# Patient Record
Sex: Male | Born: 1965 | Race: White | Hispanic: No | Marital: Married | Smoking: Never smoker
Health system: Southern US, Community
[De-identification: ages and names within clinical notes are randomized; demographics above are authoritative.]

## PROBLEM LIST (undated history)

## (undated) DIAGNOSIS — I10 Essential (primary) hypertension: Secondary | ICD-10-CM

## (undated) DIAGNOSIS — M1990 Unspecified osteoarthritis, unspecified site: Secondary | ICD-10-CM

## (undated) DIAGNOSIS — S4380XA Sprain of other specified parts of unspecified shoulder girdle, initial encounter: Secondary | ICD-10-CM

## (undated) DIAGNOSIS — Z832 Family history of diseases of the blood and blood-forming organs and certain disorders involving the immune mechanism: Secondary | ICD-10-CM

## (undated) DIAGNOSIS — G4730 Sleep apnea, unspecified: Secondary | ICD-10-CM

## (undated) DIAGNOSIS — M7542 Impingement syndrome of left shoulder: Secondary | ICD-10-CM

## (undated) DIAGNOSIS — E785 Hyperlipidemia, unspecified: Secondary | ICD-10-CM

## (undated) DIAGNOSIS — K219 Gastro-esophageal reflux disease without esophagitis: Secondary | ICD-10-CM

## (undated) DIAGNOSIS — I491 Atrial premature depolarization: Secondary | ICD-10-CM

## (undated) HISTORY — DX: Hyperlipidemia, unspecified: E78.5

## (undated) HISTORY — DX: Unspecified osteoarthritis, unspecified site: M19.90

## (undated) HISTORY — DX: Family history of diseases of the blood and blood-forming organs and certain disorders involving the immune mechanism: Z83.2

## (undated) HISTORY — PX: APPENDECTOMY: SHX54

## (undated) HISTORY — PX: OTHER SURGICAL HISTORY: SHX169

## (undated) HISTORY — DX: Atrial premature depolarization: I49.1

## (undated) HISTORY — PX: EYE SURGERY: SHX253

## (undated) HISTORY — PX: SHOULDER SURGERY: SHX246

## (undated) HISTORY — PX: COSMETIC SURGERY: SHX468

## (undated) HISTORY — PX: HEMORROIDECTOMY: SUR656

---
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
## 2003-02-28 ENCOUNTER — Encounter: Admission: RE | Admit: 2003-02-28 | Discharge: 2003-02-28 | Payer: Self-pay | Admitting: Neurosurgery

## 2003-02-28 ENCOUNTER — Encounter: Payer: Self-pay | Admitting: Neurosurgery

## 2006-12-14 ENCOUNTER — Ambulatory Visit (HOSPITAL_COMMUNITY): Admission: RE | Admit: 2006-12-14 | Discharge: 2006-12-14 | Payer: Self-pay | Admitting: Internal Medicine

## 2006-12-14 IMAGING — US US ABDOMEN COMPLETE
1 series · 14 of 25 positions shown · non-contrast
Comparison: none

CLINICAL DATA: Abdominal pain.
 ABDOMEN ULTRASOUND:
TECHNIQUE: Complete abdominal ultrasound examination was performed including evaluation of the liver, gallbladder, bile ducts, pancreas, kidneys, spleen, IVC, and abdominal aorta.

[Series 1: us abdomen complete · 0.32mm/px · 14 of 56 slices shown]
[im 1/56]
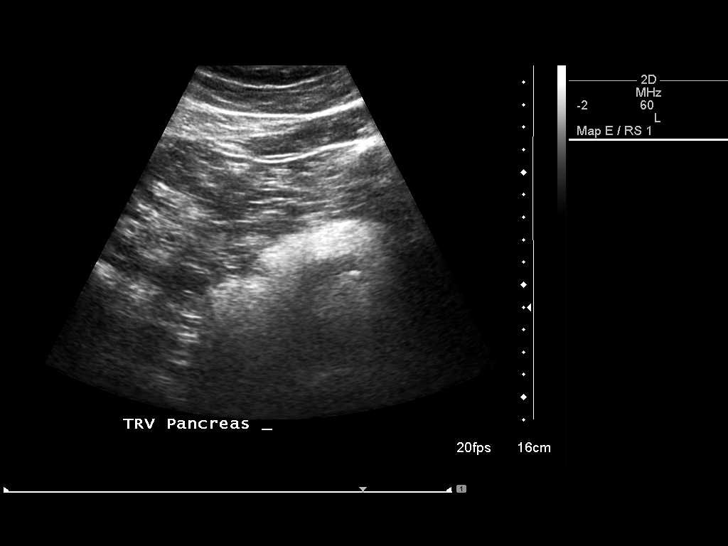
[im 5/56]
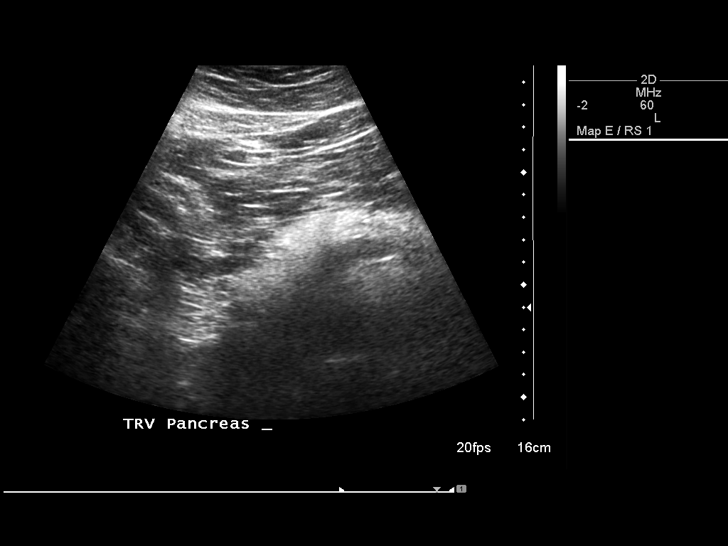
[im 10/56]
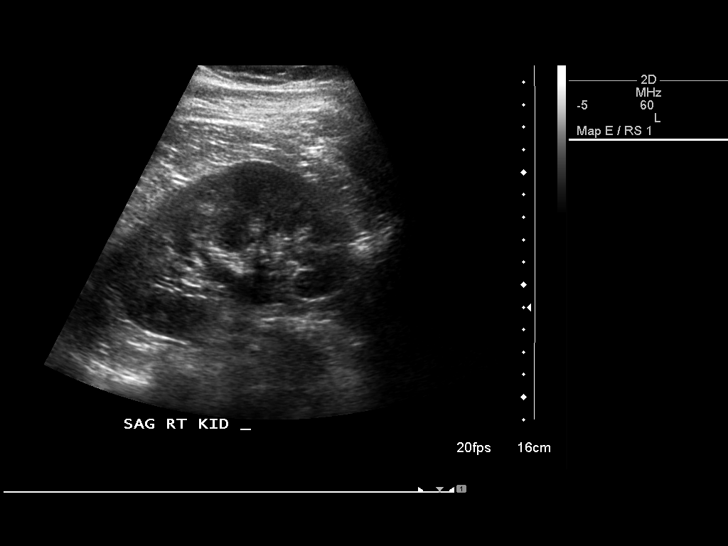
[im 14/56]
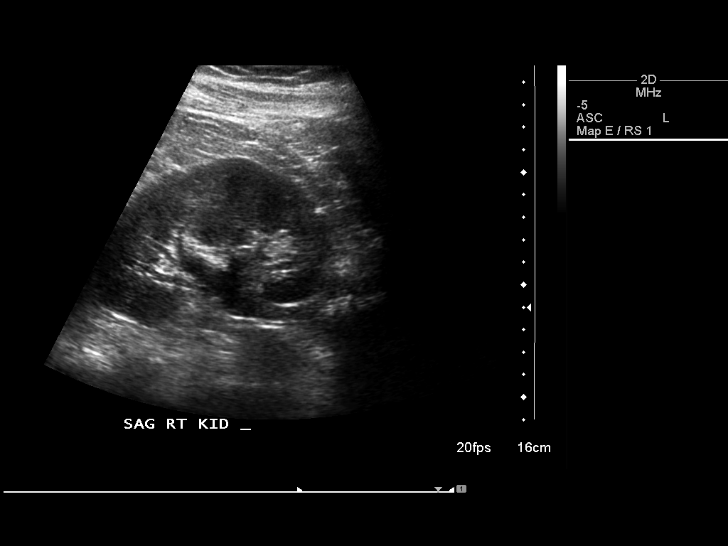
[im 19/56]
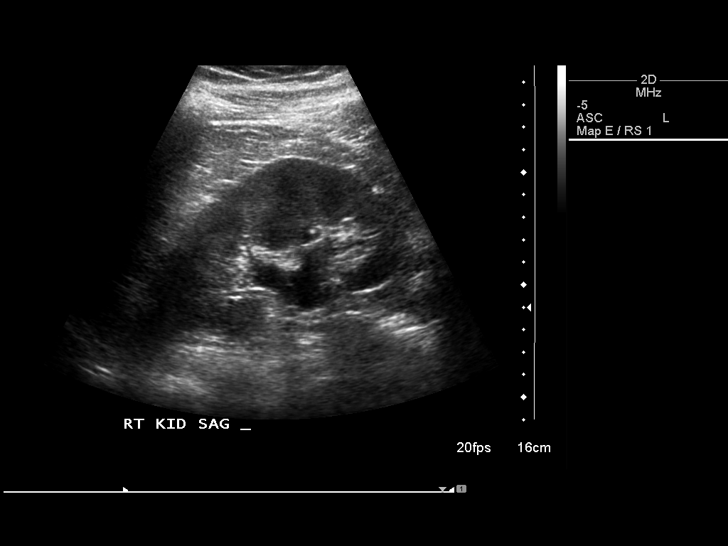
[im 21/56]
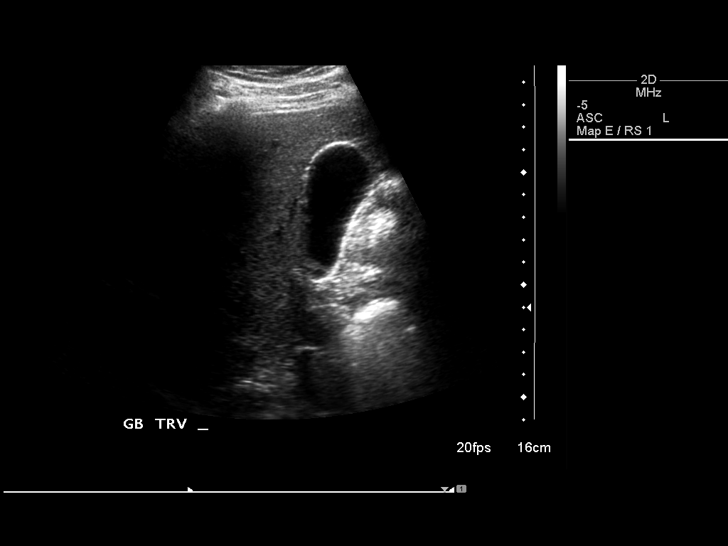
[im 26/56]
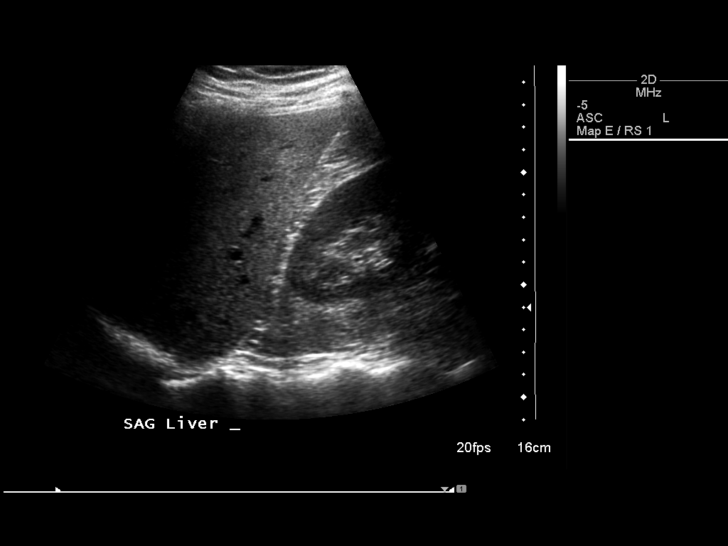
[im 30/56]
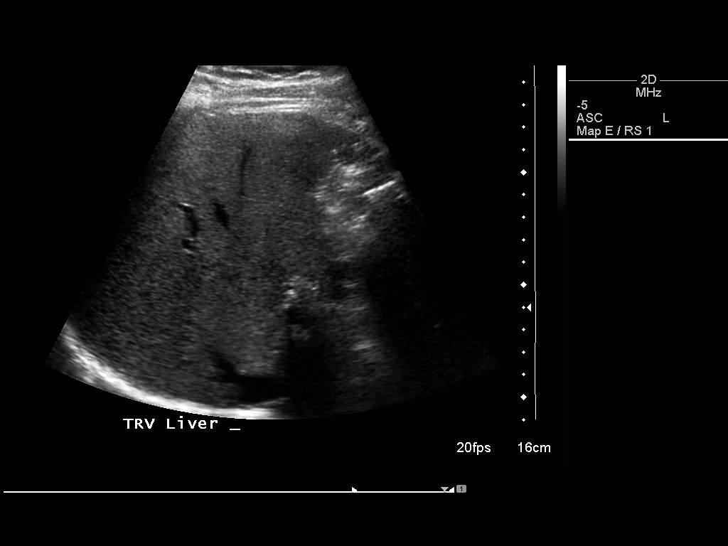
[im 35/56]
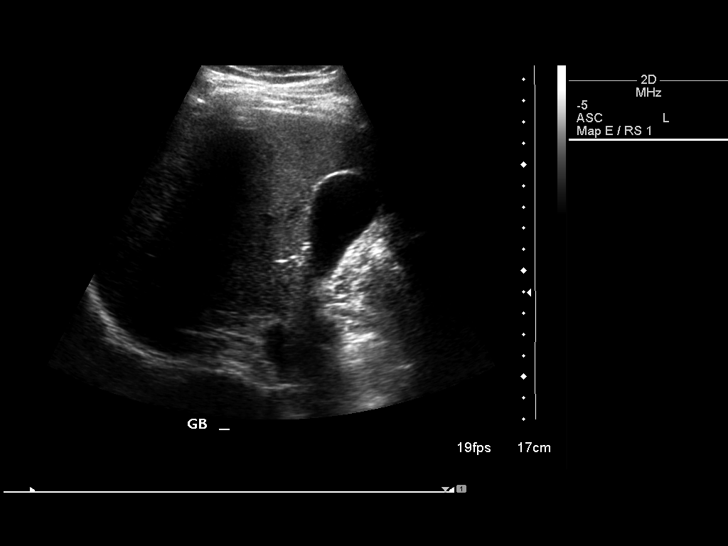
[im 37/56]
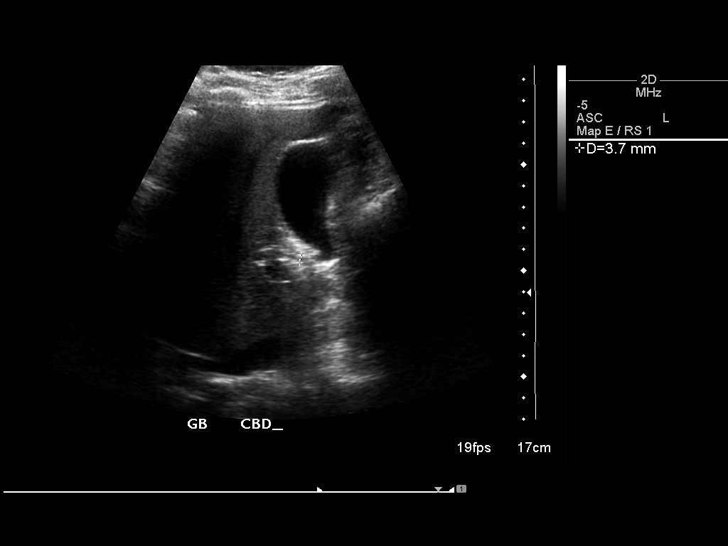
[im 42/56]
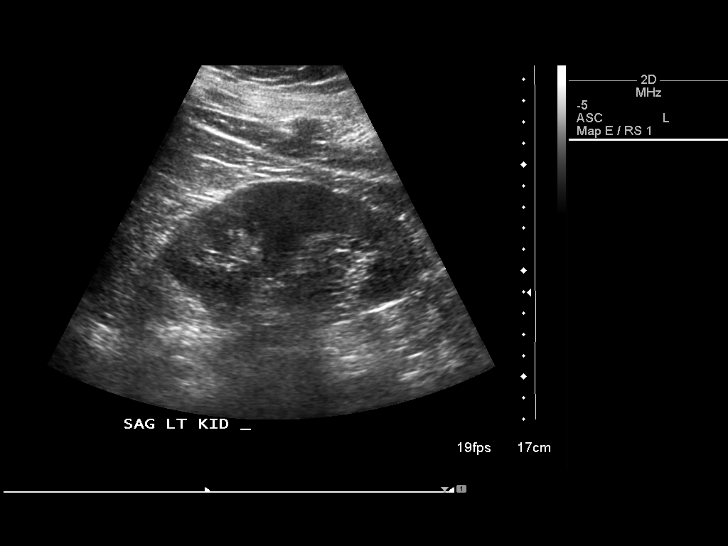
[im 46/56]
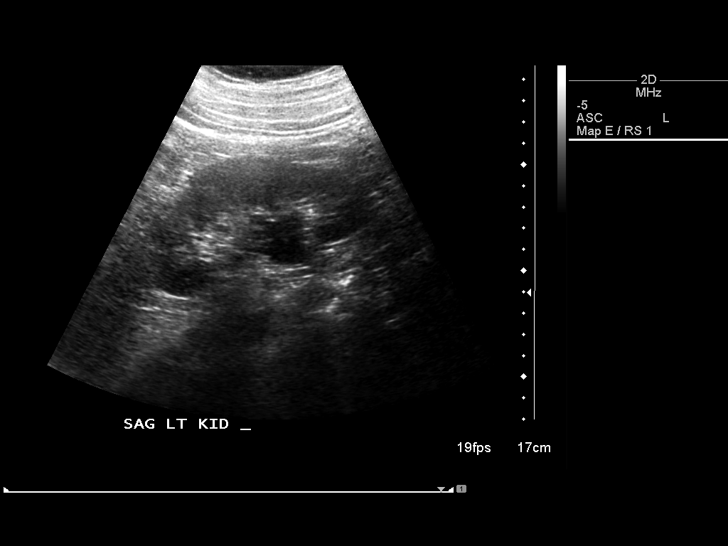
[im 51/56]
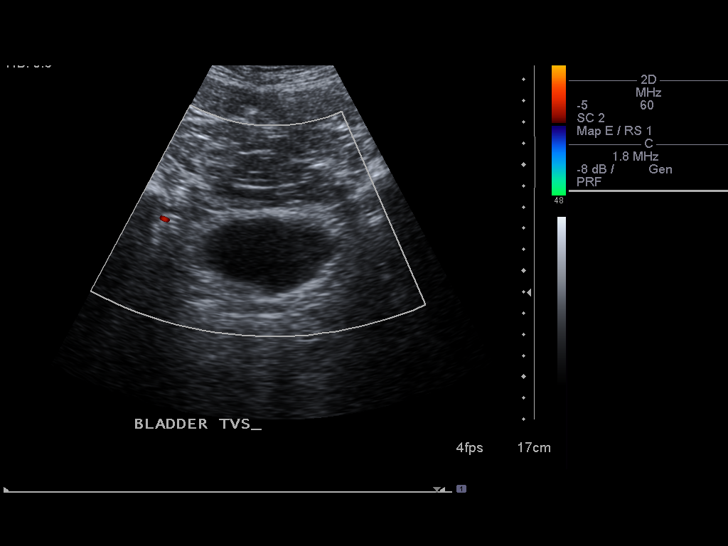
[im 56/56]
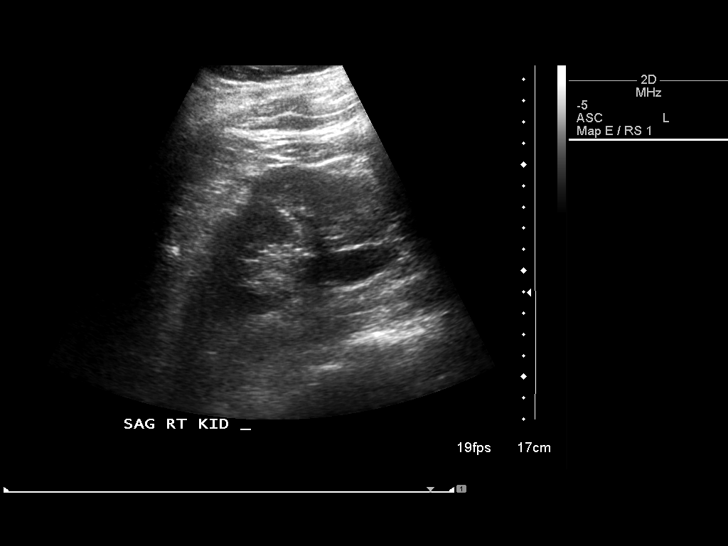

[14 of 25 positions shown; findings below may reference images not displayed]

FINDINGS: The gallbladder is well visualized and appears normal. The common bile duct is normal at 3 mm in diameter. The liver parenchyma, inferior vena cava, pancreas, spleen, and abdominal aorta are all normal. There is slight dilatation of the right renal collecting system, nonspecific.  Does the patient have renal colic-like symptoms or hematuria? There are no renal calculi.  On the left, the renal pelvis is prominent, which is probably a congenital finding, since the remainder of the left renal collecting that is visible by ultrasound is not dilated. The bladder appears grossly normal. The right kidney is 11 cm in length and the left kidney is 12.7 cm in length.
IMPRESSION: Slight dilatation of the right renal collecting system and of the left renal pelvis.  These may represent congenital variants. Does the patient have renal colic symptoms or hematuria?

## 2007-07-07 ENCOUNTER — Ambulatory Visit: Payer: Self-pay | Admitting: Internal Medicine

## 2007-07-13 ENCOUNTER — Ambulatory Visit: Payer: Self-pay

## 2007-08-13 ENCOUNTER — Ambulatory Visit: Payer: Self-pay | Admitting: Internal Medicine

## 2007-08-13 LAB — CONVERTED CEMR LAB
ALT: 43 units/L (ref 0–53)
AST: 23 units/L (ref 0–37)
Albumin: 3.9 g/dL (ref 3.5–5.2)
Alkaline Phosphatase: 80 units/L (ref 39–117)
BUN: 14 mg/dL (ref 6–23)
Bilirubin, Direct: 0.1 mg/dL (ref 0.0–0.3)
CO2: 30 meq/L (ref 19–32)
CRP, High Sensitivity: <1 — ABNORMAL LOW (ref 0.00–5.00)
Calcium: 9.4 mg/dL (ref 8.4–10.5)
Chloride: 107 meq/L (ref 96–112)
Cholesterol: 198 mg/dL (ref 0–200)
Creatinine, Ser: 0.9 mg/dL (ref 0.4–1.5)
GFR calc Af Amer: 120 mL/min
GFR calc non Af Amer: 99 mL/min
Glucose, Bld: 89 mg/dL (ref 70–99)
HDL: 45.2 mg/dL (ref >39.0)
LDL Cholesterol: 129 mg/dL — ABNORMAL HIGH (ref 0–99)
Potassium: 4.1 meq/L (ref 3.5–5.1)
Sodium: 140 meq/L (ref 135–145)
Total Bilirubin: 0.6 mg/dL (ref 0.3–1.2)
Total CHOL/HDL Ratio: 4.4
Total Protein: 6.9 g/dL (ref 6.0–8.3)
Triglycerides: 118 mg/dL (ref 0–149)
VLDL: 24 mg/dL (ref 0–40)

## 2007-09-24 ENCOUNTER — Encounter (INDEPENDENT_AMBULATORY_CARE_PROVIDER_SITE_OTHER): Payer: Self-pay | Admitting: General Surgery

## 2007-09-24 ENCOUNTER — Ambulatory Visit (HOSPITAL_COMMUNITY): Admission: RE | Admit: 2007-09-24 | Discharge: 2007-09-24 | Payer: Self-pay | Admitting: General Surgery

## 2010-10-01 NOTE — Op Note (Signed)
Larry Velez, Larry Velez              ACCOUNT NO.:  [id number]   MEDICAL RECORD NO.:  [id number]          PATIENT TYPE:  AMB   LOCATION:  DAY                           FACILITY:  [facility]   PHYSICIAN:  Dalia Heading, M.D.  DATE OF BIRTH:  11-Nov-1965   DATE OF PROCEDURE:  09/24/2007  DATE OF DISCHARGE:                               OPERATIVE REPORT   PREOPERATIVE DIAGNOSES:  Hematochezia, hemorrhoid.   POSTOPERATIVE DIAGNOSES:  Hematochezia, hemorrhoid.   PROCEDURE:  Hemorrhoidectomy, total colonoscopy.   SURGEON:  Dalia Heading, MD   ANESTHESIA:  General.   INDICATIONS:  The patient is a 44 year old white male who presents with  hematochezia as well as a hemorrhoid.  He is about to undergo a  colonoscopy and hemorrhoidectomy.  The risks and benefits of the  procedures including bleeding, infection, and perforation were fully  explained to the patient, gave informed consent.   PROCEDURE NOTE:  The patient was placed in lithotomy position after  general anesthesia was administered.  The surgical site confirmation was  performed.   Initially, a colonoscopy was performed.  The colonoscope was advanced to  the cecum without difficulty.  Confirmation of the cecum was done using  transabdominal palpation and landmarks.  The bowel preparation was  excellent.  The cecum, ascending colon, transverse colon, descending  colon, sigmoid colon, and rectum were all within normal limits.  No  abnormal lesions were noted.  An internal and external hemorrhoid was  noted at the 4 o'clock position.  All air was then evacuated from the  colon prior to removal of the endoscope.   Next, hemorrhoidectomy was performed.  The perineum was prepped and  draped using the usual sterile technique with Betadine.  Surgical site  confirmation was performed.  On rectal examination, an internal and  external hemorrhoid was noted at the 4 o'clock position.  No other mass  lesions were noted.  A LigaSure was then  used to excise the internal and  external hemorrhoid.  This vessel was then sent to pathology for further  examination.  No abnormal bleeding was noted.  0.5% Sensorcaine was  instilled into the surrounding area.  Viscous Xylocaine was then applied  to the wound.   All tape and needle counts were correct at the end of the procedure.  The patient was awakened and transferred to PACU in stable condition.   COMPLICATIONS:  None.   SPECIMEN:  Hemorrhoid.   BLOOD LOSS:  Minimal.      Dalia Heading, M.D.  Electronically Signed     MAJ/MEDQ  D:  09/24/2007  T:  09/24/2007  Job:  [id number]   cc:   Catalina Pizza, M.D.  Fax: [phone]

## 2010-10-01 NOTE — H&P (Signed)
Larry Velez, Larry Velez              ACCOUNT NO.:  [id number]   MEDICAL RECORD NO.:  [id number]          PATIENT TYPE:  AMB   LOCATION:  DAY                           FACILITY:  [facility]   PHYSICIAN:  Dalia Heading, M.D.  DATE OF BIRTH:  10/07/1965   DATE OF ADMISSION:  DATE OF DISCHARGE:  [facility]                              HISTORY & PHYSICAL   CHIEF COMPLAINT:  Hemorrhoidal disease, hematochezia.   HISTORY OF PRESENT ILLNESS:  The patient is a 45 year old white male who  is referred for endoscopic evaluation.  He needs colonoscopy for  hematochezia.  He notices blood on the toilet paper and in the stool on  occasion.  No abdominal pain, weight loss, nausea, vomiting, diarrhea,  constipation, or melena have been noted.  He has never had a  colonoscopy.  There is no family history of colon carcinoma.  He would  also like to have a hemorrhoid removed at the same time, as it gets  frequently irritated.   PAST MEDICAL HISTORY:  Includes hypertension.   PAST SURGICAL HISTORY:  Plastic surgery.   CURRENT MEDICATIONS:  Lopressor, Zocor, Nasonex, and baby aspirin.   ALLERGIES:  CODEINE.   REVIEW OF SYSTEMS:  Noncontributory.   PHYSICAL EXAMINATION:  GENERAL:  The patient is a well-developed, well-  nourished white male, in no acute distress.  LUNGS:  Clear to auscultation with equal breath sounds bilaterally.  HEART:  Reveals a regular rate and rhythm without S3, S4, or murmurs.  ABDOMEN:  Soft, nontender, and nondistended.  No hepatosplenomegaly or  masses are noted.  RECTAL:  Reveals a large external hemorrhoid present along the left  lateral aspect of the anus.   IMPRESSION:  Hemorrhoidal disease, hematochezia.   PLAN:  The patient is scheduled for colonoscopy and hemorrhoidectomy on  Sep 24, 2007.  The risks and benefits of the procedure including bleeding  and perforation were fully explained to the patient, who gave informed  consent.      Dalia Heading, M.D.  Electronically Signed     MAJ/MEDQ  D:  09/16/2007  T:  09/17/2007  Job:  [id number]   cc:   Catalina Pizza, M.D.  Fax: [phone]

## 2010-10-01 NOTE — Assessment & Plan Note (Signed)
[facility]                            CARDIOLOGY OFFICE NOTE   NAME:Velez, Larry HODES                     MRN:          [id number]  DATE:08/13/2007                            DOB:          [birth year]    PRIMARY CARE PHYSICIAN:  Catalina Pizza, M.D.   INTERVAL HISTORY:  Larry Velez is a very pleasant 45 year old male with  history of borderline hypertension and mild dyslipidemia.  He also has  palpitations secondary to PACs.  He experienced some atypical chest  pain, recurrent palpitations.  We saw him in consult last month.  He  underwent exercise Myoview which showed a good exercise capacity with an  EF of 61%.  No perfusion defects.  We also started him on some Lopressor  and had him quit his caffeine.  He says he has noticed some improvement  in his palpitations, but still gets clusters while he will feel isolated  extra beats, about 10-12 of them over 10 minutes, and then they  dissipate.  He has not had any prolonged arrhythmias.  Overall, he is  feeling some better.  He has not had any further chest pain or dyspnea.   CURRENT MEDICATIONS:  1. Lopressor 12.5 b.i.d.  2. Aspirin 81 a day.   PHYSICAL EXAMINATION:  GENERAL:  He is as well appearing in no acute  distress, ambulates around the clinic without any respiratory  difficulty.  VITAL SIGNS:  Blood pressure is 120/80, heart rate 66, weight is 216.  HEENT:  Normal.  NECK:  Supple.  No JVD.  Carotid 2+ bilaterally without bruits.  There  is no lymphadenopathy, thyromegaly.  CARDIAC:  PMI is nondisplaced.  Regular rate and rhythm.  No murmurs,  rubs or gallops.  LUNGS:  Clear.  ABDOMEN:  Soft, nontender, nondistended hepatosplenomegaly, no bruits,  no mass.  EXTREMITIES:  Warm.  No cyanosis, clubbing or edema.  NEUROLOGICAL:  Exam is grossly normal.   ASSESSMENT/PLAN:  1. Chest pain.  This was atypical.  Myoview is reassuring.  No further      workup.  2. Palpitations.  These are PACs, and  these are benign.  I have      reassured him.  He will continue taking Lopressor as needed.  3. Hyperlipidemia.  Will check fasting lipids and a CRP today.  4. Hypertension.  This is well-controlled.   DISPOSITION:  He will follow up with Dr. Margo Aye and we will see him back  in one years time for routine followup.     Bevelyn Buckles. Bensimhon, MD  Electronically Signed    DRB/MedQ  DD: 08/13/2007  DT: 08/14/2007  Job #: [id number]   cc:   Catalina Pizza, M.D.

## 2010-10-01 NOTE — Assessment & Plan Note (Signed)
[facility]                            CARDIOLOGY OFFICE NOTE   NAME:Larry Velez, Larry Velez                     MRN:          [id number]  DATE:07/07/2007                            DOB:          [birth year]    REFERRING PHYSICIAN:  Donna Christen, PA   PRIMARY CARE PHYSICIAN:  Catalina Pizza, M.D.   HISTORY OF PRESENT ILLNESS:  Mr. Hubbert is a very pleasant 45 year old  male with a history of borderline hypertension and mild dyslipidemia.  He has no known history of cardiac disease.  He was experiencing  palpitations back in August 2005 and saw Dr. Antoine Poche.  He had a normal  Holter monitor except for PACs, normal treadmill test and normal  echocardiogram.  After this episode, he became very active, lost weight  and did much better.   Unfortunately on Sunday night, he began to experience recurrent  palpitations.  He has felt a fluttering in his chest and frequent extra  hard beats.  These were fairly persistent.  There were no prolonged  episodes of palpitations.  No nausea, vomiting or diaphoresis.  Today,  he began to develop some tingling and pain in his left shoulder and down  to his elbow.  He became very concerned about this.  He went to urgent  care and had an EKG which just showed some PACs.  No ST/T wave changes.  There is no association with exertion.  He was sent here for further  evaluation.  His palpitations are feeling better.  He still has a little  bit of tingling underneath his left shoulder.   REVIEW OF SYSTEMS:  Notable for chronic back pain, some arthritis as  well as erectile dysfunction.  The remainder of the systems is negative  except for HPI and problem list.   PROBLEM:  1. History of palpitations with a negative workup as above.  2. Borderline hypertension.  3. Borderline dyslipidemia.  4. Obesity.  5. Chronic low back pain.   CURRENT MEDICATIONS:  1. Nasonex.  2. Viagra p.r.n.   ALLERGIES:  CODEINE.   SOCIAL HISTORY:  He  works as a [employer] manager.  He is married with  2 kids.  He denies any tobacco.  He drinks 2 cups of caffeine a day.   FAMILY HISTORY:  Mother is alive with a history of diabetes and  hypertension.  Father has a history of hypertension.  Grandfather has a  history of aortic aneurysm.   PHYSICAL EXAMINATION:  GENERAL:  He is well-appearing in no acute  distress.  He ambulates around the clinic without any respiratory  difficulty.  VITAL SIGNS:  Blood pressure is 118/90, heart rate of 92, weight is 218.  HEENT:  Normal.  NECK:  Supple.  No JVD.  Carotids are 2+ bilaterally without any bruits.  There is no lymphadenopathy or thyromegaly.  CARDIAC:  PMI is nondisplaced.  Regular rate and rhythm.  No murmurs,  rubs or gallops.  LUNGS:  Clear.  ABDOMEN:  Soft, nontender, nondistended.  There is no  hepatosplenomegaly, no bruits, no masses.  Good bowel sounds.  EXTREMITIES:  Warm.  No cyanosis, clubbing or edema.  Good pulses and no  rash.  NEURO:  Alert and oriented x3.  Cranial nerves II-XII are intact.  He moves all 4 extremities without  difficulty.  Affect is pleasant.   DIAGNOSTICS:  EKG shows normal sinus rhythm with occasional PACs at a  rate of 92.  There are no  ST/T wave abnormalities.   ASSESSMENT/PLAN:  1. Palpitations.  These are likely PACs.  They are benign.  We will go      ahead and start him on a little beta blocker, Lopressor 25 b.i.d.      I have asked him to cut down on his caffeine.  2. Chest and arm pain.  I suspect this is noncardiac.  However, he      does have a family history of vascular disease.  We had a long talk      about possible workup strategies including cardiac catheterization      and/or stress testing.  At this point, we will proceed with a      treadmill nuclear test to rule out any ischemia.  We will start him      on a baby aspirin.  3. Hypertension.  Blood pressure is relatively well controlled.  4. Hyperlipidemia.  This is followed by  Dr. Margo Aye.  I did suggest that      he might check a CRP to make sure this was in the normal range.      Given his erectile dysfunction and evidence of microvascular      disease, would have a low threshold to start statin therapy and      keep his LDL well controlled.   DISPOSITION:  Pending the results of his stress test.     Bevelyn Buckles. Bensimhon, MD  Electronically Signed    DRB/MedQ  DD: 07/07/2007  DT: 07/08/2007  Job #: [id number]   cc:   Catalina Pizza, M.D.  Donna Christen, PA

## 2010-11-26 ENCOUNTER — Encounter: Payer: Self-pay | Admitting: Family Medicine

## 2010-11-26 DIAGNOSIS — E785 Hyperlipidemia, unspecified: Secondary | ICD-10-CM | POA: Insufficient documentation

## 2010-11-26 DIAGNOSIS — I491 Atrial premature depolarization: Secondary | ICD-10-CM | POA: Insufficient documentation

## 2010-11-26 DIAGNOSIS — M1990 Unspecified osteoarthritis, unspecified site: Secondary | ICD-10-CM | POA: Insufficient documentation

## 2011-07-11 ENCOUNTER — Other Ambulatory Visit: Payer: Self-pay | Admitting: Orthopedic Surgery

## 2011-07-18 ENCOUNTER — Encounter (HOSPITAL_BASED_OUTPATIENT_CLINIC_OR_DEPARTMENT_OTHER): Payer: Self-pay | Admitting: Anesthesiology

## 2011-07-18 ENCOUNTER — Encounter (HOSPITAL_BASED_OUTPATIENT_CLINIC_OR_DEPARTMENT_OTHER): Payer: Self-pay | Admitting: *Deleted

## 2011-07-18 ENCOUNTER — Encounter (HOSPITAL_BASED_OUTPATIENT_CLINIC_OR_DEPARTMENT_OTHER)
Admission: RE | Disposition: A | Discharge status: Home / Self Care | Payer: Self-pay | Source: Ambulatory Visit | Attending: Orthopedic Surgery

## 2011-07-18 ENCOUNTER — Ambulatory Visit (HOSPITAL_BASED_OUTPATIENT_CLINIC_OR_DEPARTMENT_OTHER)
Admission: RE | Admit: 2011-07-18 | Discharge: 2011-07-18 | Disposition: A | Discharge status: Home / Self Care | Payer: BC Managed Care – PPO | Source: Ambulatory Visit | Attending: Orthopedic Surgery | Admitting: Orthopedic Surgery

## 2011-07-18 ENCOUNTER — Ambulatory Visit (HOSPITAL_BASED_OUTPATIENT_CLINIC_OR_DEPARTMENT_OTHER): Payer: BC Managed Care – PPO | Admitting: Anesthesiology

## 2011-07-18 ENCOUNTER — Encounter (HOSPITAL_BASED_OUTPATIENT_CLINIC_OR_DEPARTMENT_OTHER): Payer: Self-pay | Admitting: Orthopedic Surgery

## 2011-07-18 DIAGNOSIS — E785 Hyperlipidemia, unspecified: Secondary | ICD-10-CM | POA: Insufficient documentation

## 2011-07-18 DIAGNOSIS — M719 Bursopathy, unspecified: Secondary | ICD-10-CM | POA: Insufficient documentation

## 2011-07-18 DIAGNOSIS — M7542 Impingement syndrome of left shoulder: Secondary | ICD-10-CM

## 2011-07-18 DIAGNOSIS — M25819 Other specified joint disorders, unspecified shoulder: Secondary | ICD-10-CM | POA: Insufficient documentation

## 2011-07-18 DIAGNOSIS — M67919 Unspecified disorder of synovium and tendon, unspecified shoulder: Secondary | ICD-10-CM | POA: Insufficient documentation

## 2011-07-18 DIAGNOSIS — S4380XA Sprain of other specified parts of unspecified shoulder girdle, initial encounter: Secondary | ICD-10-CM | POA: Diagnosis present

## 2011-07-18 DIAGNOSIS — S46819A Strain of other muscles, fascia and tendons at shoulder and upper arm level, unspecified arm, initial encounter: Secondary | ICD-10-CM | POA: Diagnosis present

## 2011-07-18 HISTORY — DX: Strain of other muscles, fascia and tendons at shoulder and upper arm level, unspecified arm, initial encounter: S46.819A

## 2011-07-18 HISTORY — DX: Gastro-esophageal reflux disease without esophagitis: K21.9

## 2011-07-18 HISTORY — DX: Sprain of other specified parts of unspecified shoulder girdle, initial encounter: S43.80XA

## 2011-07-18 HISTORY — DX: Impingement syndrome of left shoulder: M75.42

## 2011-07-18 SURGERY — SHOULDER ARTHROSCOPY WITH SUBACROMIAL DECOMPRESSION
Anesthesia: General | Site: Shoulder | Laterality: Left | Wound class: Clean

## 2011-07-18 MED ORDER — SODIUM CHLORIDE 0.9 % IR SOLN
Status: DC | PRN
  Administered 2011-07-18: 3000 mL

## 2011-07-18 MED ORDER — MEPERIDINE HCL 25 MG/ML IJ SOLN: 6.2500 mg | INTRAMUSCULAR | Status: DC | PRN

## 2011-07-18 MED ORDER — ONDANSETRON HCL 4 MG/2ML IJ SOLN
INTRAMUSCULAR | Status: DC | PRN
  Administered 2011-07-18: 4 mg via INTRAVENOUS

## 2011-07-18 MED ORDER — DEXAMETHASONE SODIUM PHOSPHATE 4 MG/ML IJ SOLN
INTRAMUSCULAR | Status: DC | PRN
  Administered 2011-07-18: 10 mg via INTRAVENOUS

## 2011-07-18 MED ORDER — LIDOCAINE HCL (CARDIAC) 20 MG/ML IV SOLN
INTRAVENOUS | Status: DC | PRN
  Administered 2011-07-18: 50 mg via INTRAVENOUS

## 2011-07-18 MED ORDER — KETOROLAC TROMETHAMINE 10 MG PO TABS: 10.0000 mg | ORAL_TABLET | Freq: Four times a day (QID) | ORAL | Status: AC | PRN

## 2011-07-18 MED ORDER — MIDAZOLAM HCL 2 MG/2ML IJ SOLN
1.0000 mg | INTRAMUSCULAR | Status: DC | PRN
  Administered 2011-07-18: 2 mg via INTRAVENOUS

## 2011-07-18 MED ORDER — ROPIVACAINE HCL 5 MG/ML IJ SOLN
INTRAMUSCULAR | Status: DC | PRN
  Administered 2011-07-18: 30 mL via EPIDURAL

## 2011-07-18 MED ORDER — CEFAZOLIN SODIUM 1-5 GM-% IV SOLN
1.0000 g | INTRAVENOUS | Status: AC
  Administered 2011-07-18: 1 g via INTRAVENOUS

## 2011-07-18 MED ORDER — METHOCARBAMOL 500 MG PO TABS: 500.0000 mg | ORAL_TABLET | Freq: Four times a day (QID) | ORAL | Status: AC

## 2011-07-18 MED ORDER — ACETAMINOPHEN 10 MG/ML IV SOLN
1000.0000 mg | Freq: Once | INTRAVENOUS | Status: AC
  Administered 2011-07-18: 1000 mg via INTRAVENOUS

## 2011-07-18 MED ORDER — HYDROMORPHONE HCL PF 1 MG/ML IJ SOLN: 0.2500 mg | INTRAMUSCULAR | Status: DC | PRN

## 2011-07-18 MED ORDER — PROMETHAZINE HCL 25 MG PO TABS: 25.0000 mg | ORAL_TABLET | Freq: Four times a day (QID) | ORAL | Status: DC | PRN

## 2011-07-18 MED ORDER — OXYCODONE-ACETAMINOPHEN 10-325 MG PO TABS: 1.0000 | ORAL_TABLET | Freq: Four times a day (QID) | ORAL | Status: AC | PRN

## 2011-07-18 MED ORDER — PROPOFOL 10 MG/ML IV EMUL
INTRAVENOUS | Status: DC | PRN
  Administered 2011-07-18: 150 mg via INTRAVENOUS

## 2011-07-18 MED ORDER — FENTANYL CITRATE 0.05 MG/ML IJ SOLN
50.0000 ug | INTRAMUSCULAR | Status: DC | PRN
  Administered 2011-07-18: 100 ug via INTRAVENOUS

## 2011-07-18 MED ORDER — LACTATED RINGERS IV SOLN
INTRAVENOUS | Status: DC
Start: 2011-07-18 — End: 2011-07-18
  Administered 2011-07-18: 11:00:00 via INTRAVENOUS

## 2011-07-18 MED ORDER — SUCCINYLCHOLINE CHLORIDE 20 MG/ML IJ SOLN
INTRAMUSCULAR | Status: DC | PRN
  Administered 2011-07-18: 100 mg via INTRAVENOUS

## 2011-07-18 SURGICAL SUPPLY — 56 items
BENZOIN TINCTURE PRP APPL 2/3 (GAUZE/BANDAGES/DRESSINGS) ×3 IMPLANT
BLADE 4.2CUDA (BLADE) IMPLANT
BLADE CUDA GRT WHITE 3.5 (BLADE) IMPLANT
BLADE CUTTER GATOR 3.5 (BLADE) ×3 IMPLANT
BLADE GREAT WHITE 4.2 (BLADE) IMPLANT
BLADE SURG 15 STRL LF DISP TIS (BLADE) ×2 IMPLANT
BLADE SURG 15 STRL SS (BLADE) ×1
BUR OVAL 6.0 (BURR) ×3 IMPLANT
CANNULA 5.75X71 LONG (CANNULA) IMPLANT
CANNULA ACUFLEX KIT 5X76 (CANNULA) IMPLANT
CANNULA TWIST IN 8.25X7CM (CANNULA) IMPLANT
CLOTH BEACON ORANGE TIMEOUT ST (SAFETY) ×3 IMPLANT
CUTTER MENISCUS  4.2MM (BLADE)
CUTTER MENISCUS 4.2MM (BLADE) IMPLANT
DECANTER SPIKE VIAL GLASS SM (MISCELLANEOUS) IMPLANT
DRAPE INCISE IOBAN 66X45 STRL (DRAPES) ×3 IMPLANT
DRAPE SHOULDER BEACH CHAIR (DRAPES) ×3 IMPLANT
DRAPE U 20/CS (DRAPES) ×3 IMPLANT
DRAPE U-SHAPE 47X51 STRL (DRAPES) ×3 IMPLANT
DRSG PAD ABDOMINAL 8X10 ST (GAUZE/BANDAGES/DRESSINGS) ×3 IMPLANT
DURAPREP 26ML APPLICATOR (WOUND CARE) ×3 IMPLANT
ELECT REM PT RETURN 9FT ADLT (ELECTROSURGICAL) ×3
ELECTRODE REM PT RTRN 9FT ADLT (ELECTROSURGICAL) ×2 IMPLANT
GLOVE BIO SURGEON STRL SZ8 (GLOVE) ×3 IMPLANT
GLOVE INDICATOR 8.0 STRL GRN (GLOVE) ×6 IMPLANT
GLOVE ORTHO TXT STRL SZ7.5 (GLOVE) ×3 IMPLANT
GOWN PREVENTION PLUS XLARGE (GOWN DISPOSABLE) ×3 IMPLANT
GOWN PREVENTION PLUS XXLARGE (GOWN DISPOSABLE) ×3 IMPLANT
IMMOBILIZER SHOULDER XLGE (ORTHOPEDIC SUPPLIES) IMPLANT
IV NS IRRIG 3000ML ARTHROMATIC (IV SOLUTION) ×3 IMPLANT
KIT SHOULDER TRACTION (DRAPES) ×3 IMPLANT
PACK ARTHROSCOPY DSU (CUSTOM PROCEDURE TRAY) ×3 IMPLANT
PACK BASIN DAY SURGERY FS (CUSTOM PROCEDURE TRAY) ×3 IMPLANT
SET ARTHROSCOPY TUBING (MISCELLANEOUS) ×1
SET ARTHROSCOPY TUBING LN (MISCELLANEOUS) ×2 IMPLANT
SHEET MEDIUM DRAPE 40X70 STRL (DRAPES) ×3 IMPLANT
SLEEVE SCD COMPRESS KNEE MED (MISCELLANEOUS) ×3 IMPLANT
SLING ARM FOAM STRAP LRG (SOFTGOODS) ×3 IMPLANT
SLING ARM FOAM STRAP MED (SOFTGOODS) IMPLANT
SLING ARM FOAM STRAP XLG (SOFTGOODS) IMPLANT
SLING ARM IMMOBILIZER LRG (SOFTGOODS) IMPLANT
SLING ARM IMMOBILIZER MED (SOFTGOODS) IMPLANT
SPONGE GAUZE 4X4 12PLY (GAUZE/BANDAGES/DRESSINGS) ×3 IMPLANT
STRIP CLOSURE SKIN 1/2X4 (GAUZE/BANDAGES/DRESSINGS) ×3 IMPLANT
SUT FIBERWIRE #2 38 T-5 BLUE (SUTURE)
SUT MNCRL AB 4-0 PS2 18 (SUTURE) IMPLANT
SUT PDS AB 1 CT  36 (SUTURE)
SUT PDS AB 1 CT 36 (SUTURE) IMPLANT
SUT TIGER TAPE 7 IN WHITE (SUTURE) IMPLANT
SUT VIC AB 3-0 SH 27 (SUTURE)
SUT VIC AB 3-0 SH 27X BRD (SUTURE) IMPLANT
SUTURE FIBERWR #2 38 T-5 BLUE (SUTURE) IMPLANT
TOWEL OR 17X24 6PK STRL BLUE (TOWEL DISPOSABLE) ×3 IMPLANT
TUBE CONNECTING 20X1/4 (TUBING) ×3 IMPLANT
WAND STAR VAC 90 (SURGICAL WAND) ×3 IMPLANT
WATER STERILE IRR 1000ML POUR (IV SOLUTION) ×3 IMPLANT

## 2011-07-18 NOTE — H&P (Signed)
  PREOPERATIVE H&P  Chief Complaint: left shoulder sprain/strain/tear and upper arm, complete rupture of rotator cuff, impingement  HPI: Larry Velez is a 46 y.o. male who presents for preoperative history and physical with a diagnosis of left shoulder sprain/strain/tear and upper arm, complete rupture of rotator cuff, impingement. Symptoms are rated as moderate to severe, and have been worsening.  This is significantly impairing activities of daily living.  He has elected for surgical management.   Past Medical History  Diagnosis Date  . Hyperlipidemia   . DJD (degenerative joint disease)   . Heart palpitations   . Dysrhythmia     pa;pitations x 1 3 years ago, negative stress test.  no tx.  has not seen cardiologist since  . GERD (gastroesophageal reflux disease)     resolved with diet change, no meds for 3 months   Past Surgical History  Procedure Date  . Cosmetic surgery 1972    On both ears  . Hemorroidectomy 4 years ago  . Eye surgery     lasik   History   Social History  . Marital Status: Married    Spouse Name: N/A    Number of Children: N/A  . Years of Education: N/A   Social History Main Topics  . Smoking status: Not on file  . Smokeless tobacco: Not on file  . Alcohol Use:   . Drug Use:   . Sexually Active:    Other Topics Concern  . Not on file   Social History Narrative  . No narrative on file   No family history on file. Allergies  Allergen Reactions  . Amoxicillin   . Codeine    Prior to Admission medications   Medication Sig Start Date End Date Taking? Authorizing Provider  diphenhydrAMINE (BENADRYL) 25 MG tablet Take 25 mg by mouth as needed.   Yes Historical Provider, MD  HYDROcodone-acetaminophen (NORCO) 5-325 MG per tablet Take 1 tablet by mouth as needed.   Yes Historical Provider, MD  naproxen sodium (ANAPROX) 220 MG tablet Take 220 mg by mouth as needed.   Yes Historical Provider, MD  oxyCODONE-acetaminophen (PERCOCET) 5-325 MG per  tablet Take 1 tablet by mouth as needed.   Yes Historical Provider, MD     Positive ROS: All other systems have been reviewed and were otherwise negative with the exception of those mentioned in the HPI and as above.  Physical Exam: General: Alert, no acute distress Cardiovascular: No pedal edema Respiratory: No cyanosis, no use of accessory musculature GI: No organomegaly, abdomen is soft and non-tender Skin: No lesions in the area of chief complaint Neurologic: Sensation intact distally Psychiatric: Patient is competent for consent with normal mood and affect Lymphatic: No axillary or cervical lymphadenopathy  MUSCULOSKELETAL: Left shoulder has positive impingement signs with weakness with supraspinatus testing, partially secondary to pain.  Assessment: left shoulder sprain/strain/tear and upper arm, complete rupture of rotator cuff, impingement  Plan: Plan for Procedure(s): SHOULDER ARTHROSCOPY WITH ROTATOR CUFF REPAIR AND SUBACROMIAL DECOMPRESSION  The risks benefits and alternatives were discussed with the patient including but not limited to the risks of nonoperative treatment, versus surgical intervention including infection, bleeding, nerve injury,  blood clots, cardiopulmonary complications, morbidity, mortality, among others, and they were willing to proceed. We have also discussed the risk for repeat rupture, stiffness, incomplete relief of pain, among others.  Eulas Post, MD 07/18/2011 11:26 AM

## 2011-07-18 NOTE — Anesthesia Procedure Notes (Addendum)
Anesthesia Regional Block:  Interscalene brachial plexus block  Pre-Anesthetic Checklist: ,, timeout performed, Correct Patient, Correct Site, Correct Laterality, Correct Procedure, Correct Position, site marked, Risks and benefits discussed, pre-op evaluation,  At surgeon's request and post-op pain management  Laterality: Left  Prep: Maximum Sterile Barrier Precautions used and chloraprep       Needles:  Injection technique: Single-shot  Needle Type: Echogenic Stimulator Needle      Needle Gauge: 22 and 22 G    Additional Needles:  Procedures: ultrasound guided and nerve stimulator Interscalene brachial plexus block  Nerve Stimulator or Paresthesia:  Response: Biceps response, 0.4 mA,   Additional Responses:   Narrative:  Start time: 07/18/2011 11:06 AM End time: 07/18/2011 11:20 AM Injection made incrementally with aspirations every 5 mL. Anesthesiologist: Sampson Goon, MD  Additional Notes: 2% Lidocaine skin wheel.   Interscalene brachial plexus block Procedure Name: LMA Insertion Performed by: Sharyne Richters Pre-anesthesia Checklist: Patient identified, Timeout performed, Emergency Drugs available, Suction available and Patient being monitored Patient Re-evaluated:Patient Re-evaluated prior to inductionOxygen Delivery Method: Circle system utilized Preoxygenation: Pre-oxygenation with 100% oxygen Intubation Type: IV induction Ventilation: Mask ventilation without difficulty Laryngoscope Size: Miller and 2 Grade View: Grade I Tube type: Oral Tube size: 8.0 mm Number of attempts: 1 Placement Confirmation: ETT inserted through vocal cords under direct vision,  positive ETCO2 and breath sounds checked- equal and bilateral Secured at: 22 cm Tube secured with: Tape Dental Injury: Teeth and Oropharynx as per pre-operative assessment

## 2011-07-18 NOTE — Anesthesia Preprocedure Evaluation (Signed)
Anesthesia Evaluation  Patient identified by MRN, date of birth, ID band Patient awake    Reviewed: Allergy & Precautions, H&P , NPO status , Patient's Chart, lab work & pertinent test results  Airway Mallampati: II TM Distance: >3 FB Neck ROM: Full    Dental No notable dental hx. (+) Teeth Intact   Pulmonary neg pulmonary ROS,    Pulmonary exam normal       Cardiovascular neg cardio ROS     Neuro/Psych Negative Neurological ROS  Negative Psych ROS   GI/Hepatic negative GI ROS, Neg liver ROS, Controlled,  Endo/Other  Negative Endocrine ROS  Renal/GU negative Renal ROS  Genitourinary negative   Musculoskeletal   Abdominal   Peds  Hematology negative hematology ROS (+)   Anesthesia Other Findings   Reproductive/Obstetrics negative OB ROS                           Anesthesia Physical Anesthesia Plan  ASA: I  Anesthesia Plan: General   Post-op Pain Management:    Induction: Intravenous  Airway Management Planned: Oral ETT  Additional Equipment:   Intra-op Plan:   Post-operative Plan: Extubation in OR  Informed Consent: I have reviewed the patients History and Physical, chart, labs and discussed the procedure including the risks, benefits and alternatives for the proposed anesthesia with the patient or authorized representative who has indicated his/her understanding and acceptance.     Plan Discussed with: CRNA  Anesthesia Plan Comments:         Anesthesia Quick Evaluation

## 2011-07-18 NOTE — Progress Notes (Signed)
Assisted Dr. Fitzgerald with left, ultrasound guided, interscalene  block. Side rails up, monitors on throughout procedure. See vital signs in flow sheet. Tolerated Procedure well. 

## 2011-07-18 NOTE — Discharge Instructions (Signed)
Impingement Syndrome, Rotator Cuff, Bursitis with Rehab Impingement syndrome is a condition that involves inflammation of the tendons of the rotator cuff and the subacromial bursa, that causes pain in the shoulder. The rotator cuff consists of four tendons and muscles that control much of the shoulder and upper arm function. The subacromial bursa is a fluid filled sac that helps reduce friction between the rotator cuff and one of the bones of the shoulder (acromion). Impingement syndrome is usually an overuse injury that causes swelling of the bursa (bursitis), swelling of the tendon (tendonitis), and/or a tear of the tendon (strain). Strains are classified into three categories. Grade 1 strains cause pain, but the tendon is not lengthened. Grade 2 strains include a lengthened ligament, due to the ligament being stretched or partially ruptured. With grade 2 strains there is still function, although the function may be decreased. Grade 3 strains include a complete tear of the tendon or muscle, and function is usually impaired. SYMPTOMS   Pain around the shoulder, often at the outer portion of the upper arm.   Pain that gets worse with shoulder function, especially when reaching overhead or lifting.   Sometimes, aching when not using the arm.   Pain that wakes you up at night.   Sometimes, tenderness, swelling, warmth, or redness over the affected area.   Loss of strength.   Limited motion of the shoulder, especially reaching behind the back (to the back pocket or to unhook bra) or across your body.   Crackling sound (crepitation) when moving the arm.   Biceps tendon pain and inflammation (in the front of the shoulder). Worse when bending the elbow or lifting.  CAUSES  Impingement syndrome is often an overuse injury, in which chronic (repetitive) motions cause the tendons or bursa to become inflamed. A strain occurs when a force is paced on the tendon or muscle that is greater than it can  withstand. Common mechanisms of injury include: Stress from sudden increase in duration, frequency, or intensity of training.  Direct hit (trauma) to the shoulder.   Aging, erosion of the tendon with normal use.   Bony bump on shoulder (acromial spur).  RISK INCREASES WITH:  Contact sports (football, wrestling, boxing).   Throwing sports (baseball, tennis, volleyball).   Weightlifting and bodybuilding.   Heavy labor.   Previous injury to the rotator cuff, including impingement.   Poor shoulder strength and flexibility.   Failure to warm up properly before activity.   Inadequate protective equipment.   Old age.   Bony bump on shoulder (acromial spur).  PREVENTION   Warm up and stretch properly before activity.   Allow for adequate recovery between workouts.   Maintain physical fitness:   Strength, flexibility, and endurance.   Cardiovascular fitness.   Learn and use proper exercise technique.  PROGNOSIS  If treated properly, impingement syndrome usually goes away within 6 weeks. Sometimes surgery is required.  RELATED COMPLICATIONS   Longer healing time if not properly treated, or if not given enough time to heal.   Recurring symptoms, that result in a chronic condition.   Shoulder stiffness, frozen shoulder, or loss of motion.   Rotator cuff tendon tear.   Recurring symptoms, especially if activity is resumed too soon, with overuse, with a direct blow, or when using poor technique.  TREATMENT  Treatment first involves the use of ice and medicine, to reduce pain and inflammation. The use of strengthening and stretching exercises may help reduce pain with activity. These exercises may   be performed at home or with a therapist. If non-surgical treatment is unsuccessful after more than 6 months, surgery may be advised. After surgery and rehabilitation, activity is usually possible in 3 months.  MEDICATION  If pain medicine is needed, nonsteroidal  anti-inflammatory medicines (aspirin and ibuprofen), or other minor pain relievers (acetaminophen), are often advised.   Do not take pain medicine for 7 days before surgery.   Prescription pain relievers may be given, if your caregiver thinks they are needed. Use only as directed and only as much as you need.   Corticosteroid injections may be given by your caregiver. These injections should be reserved for the most serious cases, because they may only be given a certain number of times.  HEAT AND COLD  Cold treatment (icing) should be applied for 10 to 15 minutes every 2 to 3 hours for inflammation and pain, and immediately after activity that aggravates your symptoms. Use ice packs or an ice massage.   Heat treatment may be used before performing stretching and strengthening activities prescribed by your caregiver, physical therapist, or athletic trainer. Use a heat pack or a warm water soak.  SEEK MEDICAL CARE IF:   Symptoms get worse or do not improve in 4 to 6 weeks, despite treatment.   New, unexplained symptoms develop. (Drugs used in treatment may produce side effects.)  EXERCISES  RANGE OF MOTION (ROM) AND STRETCHING EXERCISES - Impingement Syndrome (Rotator Cuff  Tendinitis, Bursitis) These exercises may help you when beginning to rehabilitate your injury. Your symptoms may go away with or without further involvement from your physician, physical therapist or athletic trainer. While completing these exercises, remember:   Restoring tissue flexibility helps normal motion to return to the joints. This allows healthier, less painful movement and activity.   An effective stretch should be held for at least 30 seconds.   A stretch should never be painful. You should only feel a gentle lengthening or release in the stretched tissue.  STRETCH - Flexion, Standing  Stand with good posture. With an underhand grip on your right / left hand, and an overhand grip on the opposite hand, grasp  a broomstick or cane so that your hands are a little more than shoulder width apart.   Keeping your right / left elbow straight and shoulder muscles relaxed, push the stick with your opposite hand, to raise your right / left arm in front of your body and then overhead. Raise your arm until you feel a stretch in your right / left shoulder, but before you have increased shoulder pain.   Try to avoid shrugging your right / left shoulder as your arm rises, by keeping your shoulder blade tucked down and toward your mid-back spine. Hold for __________ seconds.   Slowly return to the starting position.  Repeat __________ times. Complete this exercise __________ times per day. STRETCH - Abduction, Supine  Lie on your back. With an underhand grip on your right / left hand and an overhand grip on the opposite hand, grasp a broomstick or cane so that your hands are a little more than shoulder width apart.   Keeping your right / left elbow straight and your shoulder muscles relaxed, push the stick with your opposite hand, to raise your right / left arm out to the side of your body and then overhead. Raise your arm until you feel a stretch in your right / left shoulder, but before you have increased shoulder pain.   Try to avoid shrugging  your right / left shoulder as your arm rises, by keeping your shoulder blade tucked down and toward your mid-back spine. Hold for __________ seconds.   Slowly return to the starting position.  Repeat __________ times. Complete this exercise __________ times per day. ROM - Flexion, Active-Assisted  Lie on your back. You may bend your knees for comfort.   Grasp a broomstick or cane so your hands are about shoulder width apart. Your right / left hand should grip the end of the stick, so that your hand is positioned "thumbs-up," as if you were about to shake hands.   Using your healthy arm to lead, raise your right / left arm overhead, until you feel a gentle stretch in your  shoulder. Hold for __________ seconds.   Use the stick to assist in returning your right / left arm to its starting position.  Repeat __________ times. Complete this exercise __________ times per day.  ROM - Internal Rotation, Supine   Lie on your back on a firm surface. Place your right / left elbow about 60 degrees away from your side. Elevate your elbow with a folded towel, so that the elbow and shoulder are the same height.   Using a broomstick or cane and your strong arm, pull your right / left hand toward your body until you feel a gentle stretch, but no increase in your shoulder pain. Keep your shoulder and elbow in place throughout the exercise.   Hold for __________ seconds. Slowly return to the starting position.  Repeat __________ times. Complete this exercise __________ times per day. STRETCH - Internal Rotation  Place your right / left hand behind your back, palm up.   Throw a towel or belt over your opposite shoulder. Grasp the towel with your right / left hand.   While keeping an upright posture, gently pull up on the towel, until you feel a stretch in the front of your right / left shoulder.   Avoid shrugging your right / left shoulder as your arm rises, by keeping your shoulder blade tucked down and toward your mid-back spine.   Hold for __________ seconds. Release the stretch, by lowering your healthy hand.  Repeat __________ times. Complete this exercise __________ times per day. ROM - Internal Rotation   Using an underhand grip, grasp a stick behind your back with both hands.   While standing upright with good posture, slide the stick up your back until you feel a mild stretch in the front of your shoulder.   Hold for __________ seconds. Slowly return to your starting position.  Repeat __________ times. Complete this exercise __________ times per day.  STRETCH - Posterior Shoulder Capsule   Stand or sit with good posture. Grasp your right / left elbow and draw it  across your chest, keeping it at the same height as your shoulder.   Pull your elbow, so your upper arm comes in closer to your chest. Pull until you feel a gentle stretch in the back of your shoulder.   Hold for __________ seconds.  Repeat __________ times. Complete this exercise __________ times per day. STRENGTHENING EXERCISES - Impingement Syndrome (Rotator Cuff Tendinitis, Bursitis) These exercises may help you when beginning to rehabilitate your injury. They may resolve your symptoms with or without further involvement from your physician, physical therapist or athletic trainer. While completing these exercises, remember:  Muscles can gain both the endurance and the strength needed for everyday activities through controlled exercises.   Complete these exercises as   instructed by your physician, physical therapist or athletic trainer. Increase the resistance and repetitions only as guided.   You may experience muscle soreness or fatigue, but the pain or discomfort you are trying to eliminate should never worsen during these exercises. If this pain does get worse, stop and make sure you are following the directions exactly. If the pain is still present after adjustments, discontinue the exercise until you can discuss the trouble with your clinician.   During your recovery, avoid activity or exercises which involve actions that place your injured hand or elbow above your head or behind your back or head. These positions stress the tissues which you are trying to heal.  STRENGTH - Scapular Depression and Adduction   With good posture, sit on a firm chair. Support your arms in front of you, with pillows, arm rests, or on a table top. Have your elbows in line with the sides of your body.   Gently draw your shoulder blades down and toward your mid-back spine. Gradually increase the tension, without tensing the muscles along the top of your shoulders and the back of your neck.   Hold for  __________ seconds. Slowly release the tension and relax your muscles completely before starting the next repetition.   After you have practiced this exercise, remove the arm support and complete the exercise in standing as well as sitting position.  Repeat __________ times. Complete this exercise __________ times per day.  STRENGTH - Shoulder Abductors, Isometric  With good posture, stand or sit about 4-6 inches from a wall, with your right / left side facing the wall.   Bend your right / left elbow. Gently press your right / left elbow into the wall. Increase the pressure gradually, until you are pressing as hard as you can, without shrugging your shoulder or increasing any shoulder discomfort.   Hold for __________ seconds.   Release the tension slowly. Relax your shoulder muscles completely before you begin the next repetition.  Repeat __________ times. Complete this exercise __________ times per day.  STRENGTH - External Rotators, Isometric  Keep your right / left elbow at your side and bend it 90 degrees.   Step into a door frame so that the outside of your right / left wrist can press against the door frame without your upper arm leaving your side.   Gently press your right / left wrist into the door frame, as if you were trying to swing the back of your hand away from your stomach. Gradually increase the tension, until you are pressing as hard as you can, without shrugging your shoulder or increasing any shoulder discomfort.   Hold for __________ seconds.   Release the tension slowly. Relax your shoulder muscles completely before you begin the next repetition.  Repeat __________ times. Complete this exercise __________ times per day.  STRENGTH - Supraspinatus   Stand or sit with good posture. Grasp a __________ weight, or an exercise band or tubing, so that your hand is "thumbs-up," like you are shaking hands.   Slowly lift your right / left arm in a "V" away from your thigh,  diagonally into the space between your side and straight ahead. Lift your hand to shoulder height or as far as you can, without increasing any shoulder pain. At first, many people do not lift their hands above shoulder height.   Avoid shrugging your right / left shoulder as your arm rises, by keeping your shoulder blade tucked down and toward your mid-back   spine.   Hold for __________ seconds. Control the descent of your hand, as you slowly return to your starting position.  Repeat __________ times. Complete this exercise __________ times per day.  STRENGTH - External Rotators  Secure a rubber exercise band or tubing to a fixed object (table, pole) so that it is at the same height as your right / left elbow when you are standing or sitting on a firm surface.   Stand or sit so that the secured exercise band is at your uninjured side.   Bend your right / left elbow 90 degrees. Place a folded towel or small pillow under your right / left arm, so that your elbow is a few inches away from your side.   Keeping the tension on the exercise band, pull it away from your body, as if pivoting on your elbow. Be sure to keep your body steady, so that the movement is coming only from your rotating shoulder.   Hold for __________ seconds. Release the tension in a controlled manner, as you return to the starting position.  Repeat __________ times. Complete this exercise __________ times per day.  STRENGTH - Internal Rotators   Secure a rubber exercise band or tubing to a fixed object (table, pole) so that it is at the same height as your right / left elbow when you are standing or sitting on a firm surface.   Stand or sit so that the secured exercise band is at your right / left side.   Bend your elbow 90 degrees. Place a folded towel or small pillow under your right / left arm so that your elbow is a few inches away from your side.   Keeping the tension on the exercise band, pull it across your body,  toward your stomach. Be sure to keep your body steady, so that the movement is coming only from your rotating shoulder.   Hold for __________ seconds. Release the tension in a controlled manner, as you return to the starting position.  Repeat __________ times. Complete this exercise __________ times per day.  STRENGTH - Scapular Protractors, Standing   Stand arms length away from a wall. Place your hands on the wall, keeping your elbows straight.   Begin by dropping your shoulder blades down and toward your mid-back spine.   To strengthen your protractors, keep your shoulder blades down, but slide them forward on your rib cage. It will feel as if you are lifting the back of your rib cage away from the wall. This is a subtle motion and can be challenging to complete. Ask your caregiver for further instruction, if you are not sure you are doing the exercise correctly.   Hold for __________ seconds. Slowly return to the starting position, resting the muscles completely before starting the next repetition.  Repeat __________ times. Complete this exercise __________ times per day. STRENGTH - Scapular Protractors, Supine  Lie on your back on a firm surface. Extend your right / left arm straight into the air while holding a __________ weight in your hand.   Keeping your head and back in place, lift your shoulder off the floor.   Hold for __________ seconds. Slowly return to the starting position, and allow your muscles to relax completely before starting the next repetition.  Repeat __________ times. Complete this exercise __________ times per day. STRENGTH - Scapular Protractors, Quadruped  Get onto your hands and knees, with your shoulders directly over your hands (or as close as you can   be, comfortably).   Keeping your elbows locked, lift the back of your rib cage up into your shoulder blades, so your mid-back rounds out. Keep your neck muscles relaxed.   Hold this position for __________  seconds. Slowly return to the starting position and allow your muscles to relax completely before starting the next repetition.  Repeat __________ times. Complete this exercise __________ times per day.  STRENGTH - Scapular Retractors  Secure a rubber exercise band or tubing to a fixed object (table, pole), so that it is at the height of your shoulders when you are either standing, or sitting on a firm armless chair.   With a palm down grip, grasp an end of the band in each hand. Straighten your elbows and lift your hands straight in front of you, at shoulder height. Step back, away from the secured end of the band, until it becomes tense.   Squeezing your shoulder blades together, draw your elbows back toward your sides, as you bend them. Keep your upper arms lifted away from your body throughout the exercise.   Hold for __________ seconds. Slowly ease the tension on the band, as you reverse the directions and return to the starting position.  Repeat __________ times. Complete this exercise __________ times per day. STRENGTH - Shoulder Extensors   Secure a rubber exercise band or tubing to a fixed object (table, pole) so that it is at the height of your shoulders when you are either standing, or sitting on a firm armless chair.   With a thumbs-up grip, grasp an end of the band in each hand. Straighten your elbows and lift your hands straight in front of you, at shoulder height. Step back, away from the secured end of the band, until it becomes tense.   Squeezing your shoulder blades together, pull your hands down to the sides of your thighs. Do not allow your hands to go behind you.   Hold for __________ seconds. Slowly ease the tension on the band, as you reverse the directions and return to the starting position.  Repeat __________ times. Complete this exercise __________ times per day.  STRENGTH - Scapular Retractors and External Rotators   Secure a rubber exercise band or tubing to a  fixed object (table, pole) so that it is at the height as your shoulders, when you are either standing, or sitting on a firm armless chair.   With a palm down grip, grasp an end of the band in each hand. Bend your elbows 90 degrees and lift your elbows to shoulder height, at your sides. Step back, away from the secured end of the band, until it becomes tense.   Squeezing your shoulder blades together, rotate your shoulders so that your upper arms and elbows remain stationary, but your fists travel upward to head height.   Hold for __________ seconds. Slowly ease the tension on the band, as you reverse the directions and return to the starting position.  Repeat __________ times. Complete this exercise __________ times per day.  STRENGTH - Scapular Retractors and External Rotators, Rowing   Secure a rubber exercise band or tubing to a fixed object (table, pole) so that it is at the height of your shoulders, when you are either standing, or sitting on a firm armless chair.   With a palm down grip, grasp an end of the band in each hand. Straighten your elbows and lift your hands straight in front of you, at shoulder height. Step back, away from the   secured end of the band, until it becomes tense.   Step 1: Squeeze your shoulder blades together. Bending your elbows, draw your hands to your chest, as if you are rowing a boat. At the end of this motion, your hands and elbow should be at shoulder height and your elbows should be out to your sides.   Step 2: Rotate your shoulders, to raise your hands above your head. Your forearms should be vertical and your upper arms should be horizontal.   Hold for __________ seconds. Slowly ease the tension on the band, as you reverse the directions and return to the starting position.  Repeat __________ times. Complete this exercise __________ times per day.  STRENGTH - Scapular Depressors  Find a sturdy chair without wheels, such as a dining room chair.    Keeping your feet on the floor, and your hands on the chair arms, lift your bottom up from the seat, and lock your elbows.   Keeping your elbows straight, allow gravity to pull your body weight down. Your shoulders will rise toward your ears.   Raise your body against gravity by drawing your shoulder blades down your back, shortening the distance between your shoulders and ears. Although your feet should always maintain contact with the floor, your feet should progressively support less body weight, as you get stronger.   Hold for __________ seconds. In a controlled and slow manner, lower your body weight to begin the next repetition.  Repeat __________ times. Complete this exercise __________ times per day.  Document Released: 05/05/2005 Document Revised: 01/15/2011 Document Reviewed: 08/17/2008 [facility] Patient Information 2012 [place], [place].  Regional Anesthesia Blocks  1. Numbness or the inability to move the "blocked" extremity may last from 3-48 hours after placement. The length of time depends on the medication injected and your individual response to the medication. If the numbness is not going away after 48 hours, call your surgeon.  2. The extremity that is blocked will need to be protected until the numbness is gone and the  Strength has returned. Because you cannot feel it, you will need to take extra care to avoid injury. Because it may be weak, you may have difficulty moving it or using it. You may not know what position it is in without looking at it while the block is in effect.  3. For blocks in the legs and feet, returning to weight bearing and walking needs to be done carefully. You will need to wait until the numbness is entirely gone and the strength has returned. You should be able to move your leg and foot normally before you try and bear weight or walk. You will need someone to be with you when you first try to ensure you do not fall and possibly risk injury.  4.  Bruising and tenderness at the needle site are common side effects and will resolve in a few days.  5. Persistent numbness or new problems with movement should be communicated to the surgeon or the [facility] [phone]).  Discharge Instructions After Orthopedic Procedures:  *You may feel tired and weak following your procedure. It is recommended that you limit physical activity for the next 24 hours and rest at home for the remainder of today and tomorrow. *No strenuous activity should be started without your doctor's permission.  Elevate the extremity that you had surgery on to a level above your heart. This should continue for 48 hours or as instructed by your doctor.  If you  had hand, arm or shoulder surgery you should move your fingers frequently unless otherwise instructed by your doctor.  If you had foot, knee or leg surgery you should wiggle your toes frequently unless otherwise instructed by your doctor.  Follow your doctor's exact instructions for activity at home. Use your home equipment as instructed. (Crutches, hard shoes, slings etc.)  Limit your activity as instructed by your doctor.  Report to your doctor should any of the following occur: 1. Extreme swelling of your fingers or toes. 2. Inability to wiggle your fingers or toes. 3. Coldness, pale or bluish color in your fingers or toes. 4. Loss of sensation, numbness or tingling of your fingers or toes. 5. Unusual smell or odor from under your dressing or cast. 6. Excessive bleeding or drainage from the surgical site. 7. Pain not relieved by medication your doctor has prescribed for you. 8. Cast or dressing too tight (do not get your dressing or cast wet or put anything under          your dressing or cast.)  *Do not change your dressing unless instructed by your doctor or discharge nurse. Then follow exact instructions.  *Follow labeled instructions for any medications that your doctor may have  prescribed for you. *Should any questions or complications develop following your procedure, PLEASE CONTACT YOUR DOCTOR.   Post Anesthesia Home Care Instructions  Activity: Get plenty of rest for the remainder of the day. A responsible adult should stay with you for 24 hours following the procedure.  For the next 24 hours, DO NOT: -Drive a car -Advertising copywriter -Drink alcoholic beverages -Take any medication unless instructed by your physician -Make any legal decisions or sign important papers.  Meals: Start with liquid foods such as gelatin or soup. Progress to regular foods as tolerated. Avoid greasy, spicy, heavy foods. If nausea and/or vomiting occur, drink only clear liquids until the nausea and/or vomiting subsides. Call your physician if vomiting continues.  Special Instructions/Symptoms: Your throat may feel dry or sore from the anesthesia or the breathing tube placed in your throat during surgery. If this causes discomfort, gargle with warm salt water. The discomfort should disappear within 24 hours.

## 2011-07-18 NOTE — Op Note (Signed)
07/18/2011  12:39 PM  PATIENT:  Larry Velez    PRE-OPERATIVE DIAGNOSIS:  left shoulder sprain/strain/tear and upper arm, question rupture of rotator cuff, impingement  POST-OPERATIVE DIAGNOSIS:  Left shoulder impingement syndrome, subscapularis partial tear  PROCEDURE:  SHOULDER ARTHROSCOPY WITH LIMITED DEBRIDEMENT OF THE SUBSCAPULARIS AND ARTHROSCOPY WITH ACROMIOPLASTY AND COMPLETE BURSECTOMY  SURGEON:  Eulas Post, MD  PHYSICIAN ASSISTANT: Janace Litten, OPA-C, present and scrubbed throughout the case, critical for completion in a timely fashion, and for retraction, instrumentation, and closure.  ANESTHESIA:   General  PREOPERATIVE INDICATIONS:  Larry Velez is a  46 y.o. male with a diagnosis of left shoulder sprain/strain/tear and upper arm, impingement who failed conservative measures and elected for surgical management.    The risks benefits and alternatives were discussed with the patient preoperatively including but not limited to the risks of infection, bleeding, nerve injury, cardiopulmonary complications, the need for revision surgery, among others, and the patient was willing to proceed.  OPERATIVE IMPLANTS: None  OPERATIVE FINDINGS: The glenohumeral articular cartilage was normal. Examination under anesthesia had full motion. Diagnostic arthroscopy demonstrated an intact biceps. There was some slight fraying of the biceps, but this is fairly minimal. The biceps pulley was intact. The upper border of the subscapularis had substantial fraying. The majority of the tendon was attached to the humeral head, and moved as a unit, however there was fraying that required debridement. The superior labrum had some slight cracking, but was stable without evidence for significant SLAP tear. The posterior labrum was intact. The rotator cuff including the infraspinatus and the supraspinatus was completely intact from both the articular and bursal side. There was some moderately  thickened bursal tissue on the bursal side which was removed. The subacromial space had some spurring underneath the acromion that was minor, although there was some fraying evident on the CA ligament. There was matching bursal irritation and thickening opposite to the CA ligament.  OPERATIVE PROCEDURE: The patient was brought to the operating room and placed in the supine position. IV antibiotics were given. General anesthesia administered. The left upper extremity was examined under anesthesia and had full motion. He was placed into the semilateral decubitus position and all bony prominences padded. The left upper extremity was prepped and draped in usual sterile fashion and diagnostic arthroscopy carried out the above-named findings. I used the arthroscopic shaver to debride the upper border of the subscapularis as well as a small portion of the biceps tendon. The rotator cuff was completely intact. At the completion of the articular arthroscopy I then went to the subacromial space.  The arthroscopic shaver was used to debride the subacromial bursa. The bursa was removed in near entirety. The CA ligament was released and I performed a very light acromioplasty. I confirmed integrity of rotator cuff through full external and internal rotation, and also viewing from the lateral portal. I completed the acromioplasty viewing from the lateral portal. All spurs were removed. The a.c. joint was pristine, and was not violated.  The shoulder was irrigated copiously and the portals closed with Monocryl followed by Steri-Strips and sterile gauze. He was awakened and returned to the PACU in stable and satisfactory condition. There were no complications and he tolerated the procedure well.

## 2011-07-18 NOTE — Transfer of Care (Signed)
Immediate Anesthesia Transfer of Care Note  Patient: Larry Velez  Procedure(s) Performed: Procedure(s) (LRB): SHOULDER ARTHROSCOPY WITH ROTATOR CUFF REPAIR AND SUBACROMIAL DECOMPRESSION (Left)  Patient Location: PACU  Anesthesia Type: General  Level of Consciousness: awake  Airway & Oxygen Therapy: Patient Spontanous Breathing and Patient connected to face mask oxygen  Post-op Assessment: Report given to PACU RN and Post -op Vital signs reviewed and stable  Post vital signs: Reviewed and stable  Complications: No apparent anesthesia complications

## 2011-07-18 NOTE — Anesthesia Postprocedure Evaluation (Signed)
  Anesthesia Post-op Note  Patient: Larry Velez  Procedure(s) Performed: Procedure(s) (LRB): SHOULDER ARTHROSCOPY WITH SUBACROMIAL DECOMPRESSION (Left)  Patient Location: PACU  Anesthesia Type: GA combined with regional for post-op pain  Level of Consciousness: awake and alert   Airway and Oxygen Therapy: Patient Spontanous Breathing and Patient connected to face mask oxygen  Post-op Pain: none  Post-op Assessment: Post-op Vital signs reviewed, Patient's Cardiovascular Status Stable, Respiratory Function Stable, Patent Airway and No signs of Nausea or vomiting  Post-op Vital Signs: Reviewed and stable  Complications: No apparent anesthesia complications

## 2012-06-21 ENCOUNTER — Ambulatory Visit: Payer: BC Managed Care – PPO | Admitting: Cardiology

## 2012-06-23 ENCOUNTER — Ambulatory Visit (INDEPENDENT_AMBULATORY_CARE_PROVIDER_SITE_OTHER): Payer: BC Managed Care – PPO | Admitting: Cardiology

## 2012-06-23 ENCOUNTER — Encounter: Payer: Self-pay | Admitting: Cardiology

## 2012-06-23 VITALS — BP 136/90 | HR 85 | Ht 70.0 in | Wt 207.8 lb

## 2012-06-23 DIAGNOSIS — E785 Hyperlipidemia, unspecified: Secondary | ICD-10-CM

## 2012-06-23 DIAGNOSIS — I491 Atrial premature depolarization: Secondary | ICD-10-CM

## 2012-06-23 DIAGNOSIS — Z832 Family history of diseases of the blood and blood-forming organs and certain disorders involving the immune mechanism: Secondary | ICD-10-CM

## 2012-06-23 MED ORDER — METOPROLOL TARTRATE 25 MG PO TABS: 12.5000 mg | ORAL_TABLET | Freq: Two times a day (BID) | ORAL | Status: DC

## 2012-06-23 NOTE — Progress Notes (Signed)
Larry Velez Date of Birth: 1965-11-17 Medical Record #[id number]  History of Present Illness: Larry Velez. Larry Velez is seen today at the request of Dr. Margo Velez for evaluation of palpitations. Larry Velez is a pleasant 70 ruled white male. Larry Velez underwent extensive evaluation in 2009. Larry Velez was diagnosed with PACs at that time. Larry Velez had a stress Myoview study that was normal. Larry Velez was placed on low-dose of metoprolol. Larry Velez took this for about a month and then his palpitations seemed to improve. Larry Velez reports that since December his palpitations have increased in frequency. They feel like an extra heartbeat. It happens multiple times a day. Larry Velez took in  Inderal 10 mg for 2 weeks but complained of feeling drowsy and weak. Larry Velez drinks a moderate amount of caffeine. Larry Velez denies any dizziness, syncope, chest pain, or shortness of breath.  Current Outpatient Prescriptions on File Prior to Visit  Medication Sig Dispense Refill  . VIAGRA 100 MG tablet Take 100 mg by mouth as needed.       . metoprolol tartrate (LOPRESSOR) 25 MG tablet Take 0.5 tablets (12.5 mg total) by mouth 2 (two) times daily.  90 tablet  3    Allergies  Allergen Reactions  . Amoxicillin   . Codeine     Past Medical History  Diagnosis Date  . Hyperlipidemia   . DJD (degenerative joint disease)   . PAC (premature atrial contraction)   . GERD (gastroesophageal reflux disease)     resolved with diet change, no meds for 3 months  . Impingement syndrome of left shoulder 07/18/2011  . Partial tear subscapularis tendon 07/18/2011  . Family history of factor V deficiency     Past Surgical History  Procedure Date  . Cosmetic surgery 1972    On both ears  . Hemorroidectomy 4 years ago  . Eye surgery     lasik    History  Smoking status  . Never Smoker   Smokeless tobacco  . Not on file    History  Alcohol Use: Not on file    Family History  Problem Relation Age of Onset  . Clotting disorder Sister   . Heart disease Sister     bicuspid AV    Review  of Systems: The review of systems is positive for family history of factor V Leiden deficiency and his sister.  She also has a bicuspid aortic valve. All other systems were reviewed and are negative.  Physical Exam: BP 136/90  Pulse 85  Ht 5\' 10"  (1.778 m)  Wt 207 lb 12.8 oz (94.257 kg)  BMI 29.82 kg/m2 Larry Velez is a pleasant overweight white male in no acute distress.The patient is alert and oriented x 3.  The mood and affect are normal.  The skin is warm and dry.  Color is normal.  The HEENT exam reveals that the sclera are nonicteric.  The mucous membranes are moist.  The carotids are 2+ without bruits.  There is no thyromegaly.  There is no JVD.  The lungs are clear.  The chest wall is non tender.  The heart exam reveals a regular rate with a normal S1 and S2.  There are no murmurs, gallops, or rubs.  The PMI is not displaced.   Abdominal exam reveals good bowel sounds.  There is no guarding or rebound.  There is no hepatosplenomegaly or tenderness.  There are no masses.  Exam of the legs reveal no clubbing, cyanosis, or edema.  The legs are without rashes.  The distal pulses are  intact.  Cranial nerves II - XII are intact.  Motor and sensory functions are intact.  The gait is normal.  LABORATORY DATA: ECG today demonstrates normal sinus rhythm with a normal ECG.  Assessment / Plan: 1. PACs. Increase frequency. I recommended avoidance of caffeine. We'll start him on metoprolol 12.5 mg twice a day as needed. I do not feel that further cardiac evaluation is needed given his prior negative workup in 2009. I have recommended fasting lab work to screen him for elevated cholesterol. Have encouraged him to increase his aerobic activity and to lose weight.  2. Family history of factor V Leiden deficiency. We will check patient's assay.

## 2012-06-23 NOTE — Patient Instructions (Signed)
Take metoprolol 12.5 mg twice a day as needed for palpitations  We will check fasting lab work and factor V assay  when convenient.  Avoid caffeine

## 2012-06-25 ENCOUNTER — Other Ambulatory Visit (INDEPENDENT_AMBULATORY_CARE_PROVIDER_SITE_OTHER): Payer: BC Managed Care – PPO

## 2012-06-25 DIAGNOSIS — I491 Atrial premature depolarization: Secondary | ICD-10-CM

## 2012-06-25 DIAGNOSIS — E785 Hyperlipidemia, unspecified: Secondary | ICD-10-CM

## 2012-06-25 DIAGNOSIS — Z832 Family history of diseases of the blood and blood-forming organs and certain disorders involving the immune mechanism: Secondary | ICD-10-CM

## 2012-06-25 LAB — BASIC METABOLIC PANEL
BUN: 18 mg/dL (ref 6–23)
Chloride: 107 mEq/L (ref 96–112)
Creatinine, Ser: 1 mg/dL (ref 0.4–1.5)
GFR: 85.4 mL/min (ref >60.00)
Glucose, Bld: 94 mg/dL (ref 70–99)

## 2012-06-25 LAB — HEPATIC FUNCTION PANEL
Albumin: 4.2 g/dL (ref 3.5–5.2)
Total Bilirubin: 0.7 mg/dL (ref 0.3–1.2)

## 2012-06-25 LAB — LIPID PANEL
Cholesterol: 200 mg/dL (ref 0–200)
LDL Cholesterol: 132 mg/dL — ABNORMAL HIGH (ref 0–99)
Triglycerides: 116 mg/dL (ref 0.0–149.0)
VLDL: 23.2 mg/dL (ref 0.0–40.0)

## 2012-06-28 LAB — FACTOR 5 LEIDEN

## 2013-03-24 ENCOUNTER — Other Ambulatory Visit: Payer: Self-pay

## 2014-01-09 ENCOUNTER — Encounter (HOSPITAL_COMMUNITY): Payer: BC Managed Care – PPO | Admitting: Anesthesiology

## 2014-01-09 ENCOUNTER — Encounter (HOSPITAL_COMMUNITY)
Admission: EM | Disposition: A | Discharge status: Home / Self Care | Payer: Self-pay | Source: Home / Self Care | Attending: Emergency Medicine

## 2014-01-09 ENCOUNTER — Observation Stay (HOSPITAL_COMMUNITY)
Admission: EM | Admit: 2014-01-09 | Discharge: 2014-01-10 | Disposition: A | Discharge status: Home / Self Care | Payer: BC Managed Care – PPO | Attending: General Surgery | Admitting: General Surgery

## 2014-01-09 ENCOUNTER — Encounter (HOSPITAL_COMMUNITY): Payer: Self-pay | Admitting: Emergency Medicine

## 2014-01-09 ENCOUNTER — Observation Stay (HOSPITAL_COMMUNITY): Payer: BC Managed Care – PPO | Admitting: Anesthesiology

## 2014-01-09 ENCOUNTER — Ambulatory Visit: Admit: 2014-01-09 | Payer: Self-pay | Admitting: General Surgery

## 2014-01-09 DIAGNOSIS — K3533 Acute appendicitis with perforation and localized peritonitis, with abscess: Secondary | ICD-10-CM | POA: Diagnosis present

## 2014-01-09 DIAGNOSIS — K353 Acute appendicitis with localized peritonitis, without perforation or gangrene: Secondary | ICD-10-CM

## 2014-01-09 DIAGNOSIS — K3580 Unspecified acute appendicitis: Secondary | ICD-10-CM

## 2014-01-09 DIAGNOSIS — R1031 Right lower quadrant pain: Secondary | ICD-10-CM | POA: Diagnosis present

## 2014-01-09 HISTORY — PX: LAPAROSCOPIC APPENDECTOMY: SHX408

## 2014-01-09 LAB — CBC WITH DIFFERENTIAL/PLATELET
Basophils Absolute: 0 10*3/uL (ref 0.0–0.1)
Basophils Relative: 0 % (ref 0–1)
EOS ABS: 0 10*3/uL (ref 0.0–0.7)
EOS PCT: 0 % (ref 0–5)
HEMATOCRIT: 45.5 % (ref 39.0–52.0)
HEMOGLOBIN: 16.1 g/dL (ref 13.0–17.0)
Lymphocytes Relative: 12 % (ref 12–46)
Lymphs Abs: 1.8 10*3/uL (ref 0.7–4.0)
MCH: 31.9 pg (ref 26.0–34.0)
MCHC: 35.4 g/dL (ref 30.0–36.0)
MCV: 90.1 fL (ref 78.0–100.0)
MONO ABS: 0.6 10*3/uL (ref 0.1–1.0)
MONOS PCT: 4 % (ref 3–12)
Neutro Abs: 12.6 10*3/uL — ABNORMAL HIGH (ref 1.7–7.7)
Neutrophils Relative %: 84 % — ABNORMAL HIGH (ref 43–77)
Platelets: 230 10*3/uL (ref 150–400)
RBC: 5.05 MIL/uL (ref 4.22–5.81)
RDW: 12.1 % (ref 11.5–15.5)
WBC: 14.9 10*3/uL — AB (ref 4.0–10.5)

## 2014-01-09 LAB — COMPREHENSIVE METABOLIC PANEL
ALK PHOS: 102 U/L (ref 39–117)
ALT: 34 U/L (ref 0–53)
ANION GAP: 13 (ref 5–15)
AST: 21 U/L (ref 0–37)
Albumin: 4.4 g/dL (ref 3.5–5.2)
BUN: 14 mg/dL (ref 6–23)
CALCIUM: 10 mg/dL (ref 8.4–10.5)
CO2: 26 mEq/L (ref 19–32)
Chloride: 100 mEq/L (ref 96–112)
Creatinine, Ser: 0.88 mg/dL (ref 0.50–1.35)
GFR calc non Af Amer: >90 mL/min (ref >90)
GLUCOSE: 110 mg/dL — AB (ref 70–99)
Potassium: 4 mEq/L (ref 3.7–5.3)
SODIUM: 139 meq/L (ref 137–147)
TOTAL PROTEIN: 7.9 g/dL (ref 6.0–8.3)
Total Bilirubin: 0.9 mg/dL (ref 0.3–1.2)

## 2014-01-09 LAB — URINALYSIS, ROUTINE W REFLEX MICROSCOPIC
Bilirubin Urine: NEGATIVE
GLUCOSE, UA: NEGATIVE mg/dL
HGB URINE DIPSTICK: NEGATIVE
Ketones, ur: NEGATIVE mg/dL
Leukocytes, UA: NEGATIVE
Nitrite: NEGATIVE
PROTEIN: NEGATIVE mg/dL
Specific Gravity, Urine: 1.005 (ref 1.005–1.030)
UROBILINOGEN UA: 0.2 mg/dL (ref 0.0–1.0)
pH: 7 (ref 5.0–8.0)

## 2014-01-09 LAB — LIPASE, BLOOD: Lipase: 26 U/L (ref 11–59)

## 2014-01-09 SURGERY — APPENDECTOMY, LAPAROSCOPIC
Anesthesia: General | Site: Abdomen

## 2014-01-09 MED ORDER — HYDROMORPHONE HCL PF 1 MG/ML IJ SOLN
0.2500 mg | INTRAMUSCULAR | Status: DC | PRN
  Administered 2014-01-09: 0.5 mg via INTRAVENOUS

## 2014-01-09 MED ORDER — NEOSTIGMINE METHYLSULFATE 10 MG/10ML IV SOLN
INTRAVENOUS | Status: DC | PRN
  Administered 2014-01-09: 5 mg via INTRAVENOUS

## 2014-01-09 MED ORDER — ROCURONIUM BROMIDE 100 MG/10ML IV SOLN
INTRAVENOUS | Status: DC | PRN
  Administered 2014-01-09: 5 mg via INTRAVENOUS
  Administered 2014-01-09: 30 mg via INTRAVENOUS

## 2014-01-09 MED ORDER — SODIUM CHLORIDE 0.9 % IV SOLN
INTRAVENOUS | Status: DC
  Administered 2014-01-10: via INTRAVENOUS

## 2014-01-09 MED ORDER — HYDROMORPHONE HCL PF 1 MG/ML IJ SOLN
INTRAMUSCULAR | Status: AC
  Filled 2014-01-09: qty 1

## 2014-01-09 MED ORDER — FENTANYL CITRATE 0.05 MG/ML IJ SOLN
INTRAMUSCULAR | Status: AC
  Filled 2014-01-09: qty 2

## 2014-01-09 MED ORDER — HYDROMORPHONE HCL PF 1 MG/ML IJ SOLN: 0.5000 mg | INTRAMUSCULAR | Status: DC | PRN

## 2014-01-09 MED ORDER — PIPERACILLIN-TAZOBACTAM 3.375 G IVPB
3.3750 g | Freq: Three times a day (TID) | INTRAVENOUS | Status: DC
  Filled 2014-01-09: qty 50

## 2014-01-09 MED ORDER — BUPIVACAINE-EPINEPHRINE 0.5% -1:200000 IJ SOLN
INTRAMUSCULAR | Status: DC | PRN
  Administered 2014-01-09: 18 mL

## 2014-01-09 MED ORDER — DEXAMETHASONE SODIUM PHOSPHATE 10 MG/ML IJ SOLN
INTRAMUSCULAR | Status: DC | PRN
  Administered 2014-01-09: 10 mg via INTRAVENOUS

## 2014-01-09 MED ORDER — MIDAZOLAM HCL 5 MG/5ML IJ SOLN
INTRAMUSCULAR | Status: DC | PRN
  Administered 2014-01-09: 2 mg via INTRAVENOUS

## 2014-01-09 MED ORDER — SODIUM CHLORIDE 0.9 % IV SOLN
Freq: Once | INTRAVENOUS | Status: AC
  Administered 2014-01-09: 17:00:00 via INTRAVENOUS

## 2014-01-09 MED ORDER — ONDANSETRON HCL 4 MG/2ML IJ SOLN
INTRAMUSCULAR | Status: DC | PRN
  Administered 2014-01-09: 4 mg via INTRAVENOUS

## 2014-01-09 MED ORDER — 0.9 % SODIUM CHLORIDE (POUR BTL) OPTIME
TOPICAL | Status: DC | PRN
  Administered 2014-01-09: 1000 mL

## 2014-01-09 MED ORDER — BUPIVACAINE-EPINEPHRINE (PF) 0.25% -1:200000 IJ SOLN
INTRAMUSCULAR | Status: AC
  Filled 2014-01-09: qty 30

## 2014-01-09 MED ORDER — PROMETHAZINE HCL 25 MG/ML IJ SOLN: 6.2500 mg | INTRAMUSCULAR | Status: DC | PRN

## 2014-01-09 MED ORDER — LIDOCAINE HCL (CARDIAC) 20 MG/ML IV SOLN
INTRAVENOUS | Status: AC
  Filled 2014-01-09: qty 5

## 2014-01-09 MED ORDER — SODIUM CHLORIDE 0.9 % IV SOLN
INTRAVENOUS | Status: DC
  Administered 2014-01-09: 125 mL/h via INTRAVENOUS

## 2014-01-09 MED ORDER — ONDANSETRON HCL 4 MG/2ML IJ SOLN
INTRAMUSCULAR | Status: AC
  Filled 2014-01-09: qty 2

## 2014-01-09 MED ORDER — DEXAMETHASONE SODIUM PHOSPHATE 10 MG/ML IJ SOLN
INTRAMUSCULAR | Status: AC
  Filled 2014-01-09: qty 1

## 2014-01-09 MED ORDER — LIDOCAINE HCL (CARDIAC) 20 MG/ML IV SOLN
INTRAVENOUS | Status: DC | PRN
  Administered 2014-01-09: 100 mg via INTRAVENOUS

## 2014-01-09 MED ORDER — FENTANYL CITRATE 0.05 MG/ML IJ SOLN
INTRAMUSCULAR | Status: AC
  Filled 2014-01-09: qty 5

## 2014-01-09 MED ORDER — ONDANSETRON HCL 4 MG/2ML IJ SOLN: 4.0000 mg | Freq: Four times a day (QID) | INTRAMUSCULAR | Status: DC | PRN

## 2014-01-09 MED ORDER — LACTATED RINGERS IV SOLN
INTRAVENOUS | Status: DC | PRN
  Administered 2014-01-09 (×2): via INTRAVENOUS

## 2014-01-09 MED ORDER — MIDAZOLAM HCL 2 MG/2ML IJ SOLN
INTRAMUSCULAR | Status: AC
  Filled 2014-01-09: qty 2

## 2014-01-09 MED ORDER — ONDANSETRON HCL 4 MG/2ML IJ SOLN
4.0000 mg | Freq: Once | INTRAMUSCULAR | Status: DC
Start: 2014-01-09 — End: 2014-01-10

## 2014-01-09 MED ORDER — GLYCOPYRROLATE 0.2 MG/ML IJ SOLN
INTRAMUSCULAR | Status: DC | PRN
  Administered 2014-01-09: .8 mg via INTRAVENOUS

## 2014-01-09 MED ORDER — LACTATED RINGERS IR SOLN
Status: DC | PRN
  Administered 2014-01-09: 1

## 2014-01-09 MED ORDER — OXYCODONE-ACETAMINOPHEN 5-325 MG PO TABS
1.0000 | ORAL_TABLET | ORAL | Status: DC | PRN
  Administered 2014-01-10: 1 via ORAL
  Filled 2014-01-09: qty 1

## 2014-01-09 MED ORDER — FENTANYL CITRATE 0.05 MG/ML IJ SOLN
INTRAMUSCULAR | Status: DC
Start: 2014-01-09 — End: 2014-01-09
  Filled 2014-01-09: qty 2

## 2014-01-09 MED ORDER — FENTANYL CITRATE 0.05 MG/ML IJ SOLN
INTRAMUSCULAR | Status: DC | PRN
  Administered 2014-01-09 (×7): 50 ug via INTRAVENOUS

## 2014-01-09 MED ORDER — PIPERACILLIN-TAZOBACTAM 3.375 G IVPB
3.3750 g | Freq: Once | INTRAVENOUS | Status: AC
  Administered 2014-01-09: 3.375 g via INTRAVENOUS
  Filled 2014-01-09: qty 50

## 2014-01-09 MED ORDER — PROPOFOL 10 MG/ML IV BOLUS
INTRAVENOUS | Status: DC | PRN
  Administered 2014-01-09: 170 mg via INTRAVENOUS

## 2014-01-09 MED ORDER — SUCCINYLCHOLINE CHLORIDE 20 MG/ML IJ SOLN
INTRAMUSCULAR | Status: DC | PRN
  Administered 2014-01-09: 100 mg via INTRAVENOUS

## 2014-01-09 SURGICAL SUPPLY — 39 items
APPLIER CLIP 5 13 M/L LIGAMAX5 (MISCELLANEOUS)
APPLIER CLIP ROT 10 11.4 M/L (STAPLE) ×3
CANISTER SUCTION 2500CC (MISCELLANEOUS) ×3 IMPLANT
CHLORAPREP W/TINT 26ML (MISCELLANEOUS) ×3 IMPLANT
CLIP APPLIE 5 13 M/L LIGAMAX5 (MISCELLANEOUS) IMPLANT
CLIP APPLIE ROT 10 11.4 M/L (STAPLE) ×1 IMPLANT
CLOSURE WOUND 1/2 X4 (GAUZE/BANDAGES/DRESSINGS) ×1
CUTTER FLEX LINEAR 45M (STAPLE) ×3 IMPLANT
DECANTER SPIKE VIAL GLASS SM (MISCELLANEOUS) ×3 IMPLANT
DERMABOND ADVANCED (GAUZE/BANDAGES/DRESSINGS) ×2
DERMABOND ADVANCED .7 DNX12 (GAUZE/BANDAGES/DRESSINGS) ×1 IMPLANT
DRAPE LAPAROSCOPIC ABDOMINAL (DRAPES) ×3 IMPLANT
ELECT REM PT RETURN 9FT ADLT (ELECTROSURGICAL) ×3
ELECTRODE REM PT RTRN 9FT ADLT (ELECTROSURGICAL) ×1 IMPLANT
GLOVE BIOGEL PI IND STRL 7.0 (GLOVE) ×1 IMPLANT
GLOVE BIOGEL PI INDICATOR 7.0 (GLOVE) ×2
GLOVE SS BIOGEL STRL SZ 7.5 (GLOVE) ×1 IMPLANT
GLOVE SUPERSENSE BIOGEL SZ 7.5 (GLOVE) ×2
GOWN STRL REUS W/TWL LRG LVL3 (GOWN DISPOSABLE) ×3 IMPLANT
GOWN STRL REUS W/TWL XL LVL3 (GOWN DISPOSABLE) ×6 IMPLANT
IV LACTATED RINGERS 1000ML (IV SOLUTION) ×3 IMPLANT
KIT BASIN OR (CUSTOM PROCEDURE TRAY) ×3 IMPLANT
NS IRRIG 1000ML POUR BTL (IV SOLUTION) ×3 IMPLANT
PENCIL BUTTON HOLSTER BLD 10FT (ELECTRODE) ×3 IMPLANT
POUCH SPECIMEN RETRIEVAL 10MM (ENDOMECHANICALS) ×3 IMPLANT
RELOAD 45 VASCULAR/THIN (ENDOMECHANICALS) IMPLANT
RELOAD STAPLE TA45 3.5 REG BLU (ENDOMECHANICALS) ×3 IMPLANT
SET IRRIG TUBING LAPAROSCOPIC (IRRIGATION / IRRIGATOR) ×3 IMPLANT
SHEARS HARMONIC ACE PLUS 36CM (ENDOMECHANICALS) ×3 IMPLANT
SOLUTION ANTI FOG 6CC (MISCELLANEOUS) ×3 IMPLANT
STRIP CLOSURE SKIN 1/2X4 (GAUZE/BANDAGES/DRESSINGS) ×2 IMPLANT
SUT MNCRL AB 4-0 PS2 18 (SUTURE) ×3 IMPLANT
TOWEL OR 17X26 10 PK STRL BLUE (TOWEL DISPOSABLE) ×3 IMPLANT
TRAY FOLEY CATH 14FRSI W/METER (CATHETERS) ×3 IMPLANT
TRAY LAP CHOLE (CUSTOM PROCEDURE TRAY) ×3 IMPLANT
TROCAR BLADELESS OPT 5 75 (ENDOMECHANICALS) ×3 IMPLANT
TROCAR XCEL 12X100 BLDLESS (ENDOMECHANICALS) ×3 IMPLANT
TROCAR XCEL BLUNT TIP 100MML (ENDOMECHANICALS) ×3 IMPLANT
TUBING INSUFFLATION 10FT LAP (TUBING) ×3 IMPLANT

## 2014-01-09 NOTE — ED Provider Notes (Signed)
CSN: [id number]     Arrival date & time 01/09/14  1547 History   First MD Initiated Contact with Patient 01/09/14 1615     Chief Complaint  Patient presents with  . Abdominal Pain    rlq     (Consider location/radiation/quality/duration/timing/severity/associated sxs/prior Treatment) Patient is a 48 y.o. male presenting with abdominal pain.  Abdominal Pain Pain location:  RLQ Pain quality: aching   Pain radiates to:  Does not radiate Pain severity:  Moderate Onset quality:  Gradual Duration:  1 day Timing:  Constant Progression:  Worsening Chronicity:  New Context comment:  Seen at [facility] with CT scan with appendicitis Relieved by:  Nothing Worsened by:  Nothing tried Ineffective treatments:  None tried Associated symptoms: anorexia, chills and nausea   Associated symptoms: no chest pain, no constipation, no cough, no diarrhea, no dysuria, no fever, no hematuria, no shortness of breath, no sore throat and no vomiting     Past Medical History  Diagnosis Date  . Hyperlipidemia   . DJD (degenerative joint disease)   . PAC (premature atrial contraction)   . GERD (gastroesophageal reflux disease)     resolved with diet change, no meds for 3 months  . Impingement syndrome of left shoulder 07/18/2011  . Partial tear subscapularis tendon 07/18/2011  . Family history of factor V deficiency    Past Surgical History  Procedure Laterality Date  . Cosmetic surgery  1972    On both ears  . Hemorroidectomy  4 years ago  . Eye surgery      lasik  . Left shoulder surgery     Family History  Problem Relation Age of Onset  . Clotting disorder Sister   . Heart disease Sister     bicuspid AV   History  Substance Use Topics  . Smoking status: Never Smoker   . Smokeless tobacco: Never Used  . Alcohol Use: Yes     Comment: 1 or 2 bottles beer per week    Review of Systems  Constitutional: Positive for chills. Negative for fever.  HENT: Negative for congestion, rhinorrhea and  sore throat.   Eyes: Negative for photophobia and visual disturbance.  Respiratory: Negative for cough and shortness of breath.   Cardiovascular: Negative for chest pain and leg swelling.  Gastrointestinal: Positive for nausea, abdominal pain and anorexia. Negative for vomiting, diarrhea and constipation.  Endocrine: Negative for polydipsia and polyuria.  Genitourinary: Negative for dysuria and hematuria.  Musculoskeletal: Negative for arthralgias and back pain.  Skin: Negative for color change and rash.  Neurological: Negative for dizziness, syncope, light-headedness and headaches.  Hematological: Negative for adenopathy. Does not bruise/bleed easily.  All other systems reviewed and are negative.     Allergies  Amoxicillin and Codeine  Home Medications   Prior to Admission medications   Medication Sig Start Date End Date Taking? Authorizing Provider  naproxen sodium (ANAPROX) 220 MG tablet Take 220 mg by mouth 2 (two) times daily with a meal.   Yes Historical Provider, MD   BP 152/88  Pulse 94  Temp(Src) 97.8 F (36.6 C) (Oral)  Resp 22  Wt 207 lb (93.895 kg)  SpO2 98% Physical Exam  Vitals reviewed. Constitutional: He is oriented to person, place, and time. He appears well-developed and well-nourished.  HENT:  Head: Normocephalic and atraumatic.  Eyes: Conjunctivae and EOM are normal.  Neck: Normal range of motion. Neck supple.  Cardiovascular: Normal rate, regular rhythm and normal heart sounds.   Pulmonary/Chest: Effort normal and  breath sounds normal. No respiratory distress.  Abdominal: He exhibits no distension. There is tenderness in the right lower quadrant. There is no rebound and no guarding.  Musculoskeletal: Normal range of motion.  Neurological: He is alert and oriented to person, place, and time.  Skin: Skin is warm and dry.    ED Course  Procedures (including critical care time) Labs Review Labs Reviewed  CBC WITH DIFFERENTIAL - Abnormal; Notable for  the following:    WBC 14.9 (*)    Neutrophils Relative % 84 (*)    Neutro Abs 12.6 (*)    All other components within normal limits  COMPREHENSIVE METABOLIC PANEL - Abnormal; Notable for the following:    Glucose, Bld 110 (*)    All other components within normal limits  URINALYSIS, ROUTINE W REFLEX MICROSCOPIC - Abnormal; Notable for the following:    APPearance CLOUDY (*)    All other components within normal limits  LIPASE, BLOOD    Imaging Review No results found.   EKG Interpretation None      MDM   Final diagnoses:  Acute appendicitis with localized peritonitis    [age]y.o. male  with pertinent PMH of borderline HLD, GERD presents with RLQ abdominal pain times one day.  Patient was in his normal state of health until yesterday when he began to develop right lower quadrant abdominal tenderness and pain. Today he felt chills, nausea, and one episode of diarrhea.  He was seen today at an urgent care center oriented CT scan performed which demonstrated acute appendicitis.  He came here for further management. On arrival vital signs and physical exam as above. Physical exam and history consistent with acute appendicitis, however there is no rebound available at this time. Consulted surgery.   Labs and imaging as above reviewed. Pt taken to OR from ED after zosyn per surgery request.    1. Acute appendicitis with localized peritonitis         Mirian Mo, MD 01/09/14 [phone]

## 2014-01-09 NOTE — ED Notes (Signed)
Pt c/o RLQ pain, had CT done at [facility] and was told he had an appendicitis. Pt c/o nausea no vomiting.

## 2014-01-09 NOTE — H&P (Signed)
Larry Velez is an 48 y.o. male.   Chief Complaint: Abdominal pain HPI: This is a healthy 48 year old Caucasian gentleman who was well until yesterday when he developed nonlocalizing crampy pain. The pain got worse during the night it is now localized to the right lower quadrant. He's been nauseated but has not vomited. He has not had any diarrhea. He is voiding well. WBC 14,900. Chemistries and urinalysis normal.  CT scan of the head and pelvis was done to try and imaging of the chemistry. We loaded the disc and I reviewed this with Dr. Darrick Meigs on in radiology. The appendix is inflamed but there is no free air and no abscess or free fluid. No other abnormalities were noted. He is being admitted and prepare for appendectomy. Past Medical History  Diagnosis Date  . Hyperlipidemia   . DJD (degenerative joint disease)   . PAC (premature atrial contraction)   . GERD (gastroesophageal reflux disease)     resolved with diet change, no meds for 3 months  . Impingement syndrome of left shoulder 07/18/2011  . Partial tear subscapularis tendon 07/18/2011  . Family history of factor V deficiency     Past Surgical History  Procedure Laterality Date  . Cosmetic surgery  1972    On both ears  . Hemorroidectomy  4 years ago  . Eye surgery      lasik  . Left shoulder surgery      Family History  Problem Relation Age of Onset  . Clotting disorder Sister   . Heart disease Sister     bicuspid AV   Social History:  reports that he has never smoked. He has never used smokeless tobacco. He reports that he drinks alcohol. He reports that he does not use illicit drugs.  Allergies:  Allergies  Allergen Reactions  . Amoxicillin     Stomach cramps  . Codeine Nausea And Vomiting    Stomach cramps     (Not in a hospital admission)  Results for orders placed during the hospital encounter of 01/09/14 (from the past 48 hour(s))  CBC WITH DIFFERENTIAL     Status: Abnormal   Collection Time   01/09/14  4:21 PM      Result Value Ref Range   WBC 14.9 (*) 4.0 - 10.5 K/uL   RBC 5.05  4.22 - 5.81 MIL/uL   Hemoglobin 16.1  13.0 - 17.0 g/dL   HCT 45.5  39.0 - 52.0 %   MCV 90.1  78.0 - 100.0 fL   MCH 31.9  26.0 - 34.0 pg   MCHC 35.4  30.0 - 36.0 g/dL   RDW 12.1  11.5 - 15.5 %   Platelets 230  150 - 400 K/uL   Neutrophils Relative % 84 (*) 43 - 77 %   Neutro Abs 12.6 (*) 1.7 - 7.7 K/uL   Lymphocytes Relative 12  12 - 46 %   Lymphs Abs 1.8  0.7 - 4.0 K/uL   Monocytes Relative 4  3 - 12 %   Monocytes Absolute 0.6  0.1 - 1.0 K/uL   Eosinophils Relative 0  0 - 5 %   Eosinophils Absolute 0.0  0.0 - 0.7 K/uL   Basophils Relative 0  0 - 1 %   Basophils Absolute 0.0  0.0 - 0.1 K/uL  COMPREHENSIVE METABOLIC PANEL     Status: Abnormal   Collection Time    01/09/14  4:21 PM      Result Value Ref Range  Sodium 139  137 - 147 mEq/L   Potassium 4.0  3.7 - 5.3 mEq/L   Chloride 100  96 - 112 mEq/L   CO2 26  19 - 32 mEq/L   Glucose, Bld 110 (*) 70 - 99 mg/dL   BUN 14  6 - 23 mg/dL   Creatinine, Ser 0.88  0.50 - 1.35 mg/dL   Calcium 10.0  8.4 - 10.5 mg/dL   Total Protein 7.9  6.0 - 8.3 g/dL   Albumin 4.4  3.5 - 5.2 g/dL   AST 21  0 - 37 U/L   ALT 34  0 - 53 U/L   Alkaline Phosphatase 102  39 - 117 U/L   Total Bilirubin 0.9  0.3 - 1.2 mg/dL   GFR calc non Af Amer >90  >90 mL/min   GFR calc Af Amer >90  >90 mL/min   Comment: (NOTE)     The eGFR has been calculated using the CKD EPI equation.     This calculation has not been validated in all clinical situations.     eGFR's persistently <90 mL/min signify possible Chronic Kidney     Disease.   Anion gap 13  5 - 15  LIPASE, BLOOD     Status: None   Collection Time    01/09/14  4:21 PM      Result Value Ref Range   Lipase 26  11 - 59 U/L  URINALYSIS, ROUTINE W REFLEX MICROSCOPIC     Status: Abnormal   Collection Time    01/09/14  4:37 PM      Result Value Ref Range   Color, Urine YELLOW  YELLOW   APPearance CLOUDY (*) CLEAR    Specific Gravity, Urine 1.005  1.005 - 1.030   pH 7.0  5.0 - 8.0   Glucose, UA NEGATIVE  NEGATIVE mg/dL   Hgb urine dipstick NEGATIVE  NEGATIVE   Bilirubin Urine NEGATIVE  NEGATIVE   Ketones, ur NEGATIVE  NEGATIVE mg/dL   Protein, ur NEGATIVE  NEGATIVE mg/dL   Urobilinogen, UA 0.2  0.0 - 1.0 mg/dL   Nitrite NEGATIVE  NEGATIVE   Leukocytes, UA NEGATIVE  NEGATIVE   Comment: MICROSCOPIC NOT DONE ON URINES WITH NEGATIVE PROTEIN, BLOOD, LEUKOCYTES, NITRITE, OR GLUCOSE <1000 mg/dL.   No results found.  ROS 12 system review of systems is performed and is negative except as described above. History of left shoulder surgery. Hemorrhoidectomy. Colonoscopy 7 years ago. Allergy to amoxicillin reveals abdominal cramps but no skin rash or true allergy. He also is intolerant of codeine. He takes no medications.  Blood pressure 152/88, pulse 94, temperature 97.8 F (36.6 C), temperature source Oral, resp. rate 22, weight 207 lb (93.895 kg), SpO2 98.00%. Physical Exam  Gen.: Alert. Cooperative. Mild distress. HEENT: Pupils equally round reactive to light and accommodation. Extraocular is intact. Nose oropharynx and external auditory canals are without gross lesions Neck: Supple. Nontender. No adenopathy or mass Lungs: Clear to auscultation bilaterally Heart: Regular rate and rhythm. No murmur. No ectopy Abdomen: Soft. Not distended. Localized tenderness with guarding and percussion tenderness right lower quadrant. Soft elsewhere. No mass. No hernia at the umbilicus or inguinal areas. Extremities: Moves all extr.  well without pain or deformity or edema  Neurologic Exam without gross motor or sensory deficits. Alert. Mental status normal.  Assessment/Plan Acute appendicitis. Clinically and radiographically this is not ruptured. Admit IV antibiotics He will be taken to the operating room tonight for laparoscopic appendectomy, possible open. I discussed  this with Dr. Adonis Housekeeper who will assume his  care and perform the surgery. The patient is aware of this and is very comfortable with this.  I have discussed indications, details, techniques, and numerous risk of the surgery with him. He is aware of the risk of bleeding, infection, conversion to open laparotomy, postop abscess, injury to adjacent organs with major reconstructive surgery, cardiac, pulmonary, and thromboembolic problems. He understands these issues well. All of his his questions are answered. He agrees with this plan.  Hesper Venturella M 01/09/2014, 5:41 PM

## 2014-01-09 NOTE — Op Note (Signed)
Preoperative Diagnosis: Acute appendicitis with localized peritonitis [540.1]  Postoprative Diagnosis: Acute appendicitis with localized peritonitis [540.1]  Procedure: Procedure(s): APPENDECTOMY LAPAROSCOPIC   Surgeon: Glenna Fellows T   Assistants: None  Anesthesia:  General endotracheal anesthesia  Indications: Patient is a 48 year old male who presents with acute lower abdominal pain and physical findings consistent with acute appendicitis. CT scan has shown evidence of acute appendicitis without rupture or abscess. We have recommended proceeding with laparoscopic appendectomy for treatment. I discussed the procedure with him including risks of anesthetic complications, bleeding and infection. He understands and agrees to proceed.  Procedure Detail:  Patient was brought to the operating room, placed in supine position on the operating table, and general endotracheal anesthesia induced. He received preoperative IV antibiotics. The PAS ring placed. Foley catheter was placed. The abdomen was widely sterilely prepped and draped and correct patient and procedure verified.  Access was obtained with a 1 cm open Hassan technique at the umbilicus without difficulty through mattress suture of 0 Vicryl and pneumoperitoneum established. Under direct vision a 5 mm trocar was placed in the right upper quadrant and a 12 mm trocar in the left lower quadrant. The appendix was exposed just beneath the cecum and was acutely inflamed in its distal half and some exudate but no perforation or gangrene at the base of the appendix relatively uninflamed. Lateral peritoneal attachments were divided with harmonic scalpel mobilizing the appendix. The mesial appendix was then sequentially divided with the harmonic scalpel until the appendix was completely freed down to the tip of the cecum. The appendix was then divided across the tip of the cecum with a single firing of the GIA 45 mm blue load stapler. The appendix was  placed in an Endo Catch bag and brought out through the umbilical incision. The right lower quadrant and abdomen were thoroughly irrigated until clear and hemostasis confirmed. All CO2 was evacuated and trochars removed. A mattress suture was secured at the umbilicus. Skin incisions were closed with subcuticular Monocryl and Dermabond. Sponge needle and instrument counts were correct.    Findings: Acute appendicitis without perforation or gangrene  Estimated Blood Loss:  Minimal         Drains: none  Blood Given: none          Specimens: Appendix        Complications:  * No complications entered in OR log *         Disposition: PACU - hemodynamically stable.         Condition: stable

## 2014-01-09 NOTE — Anesthesia Preprocedure Evaluation (Signed)
Anesthesia Evaluation  Patient identified by MRN, date of birth, ID band Patient awake    Reviewed: Allergy & Precautions, H&P , NPO status , Patient's Chart, lab work & pertinent test results  Airway Mallampati: II TM Distance: >3 FB Neck ROM: Full    Dental no notable dental hx.    Pulmonary neg pulmonary ROS,  breath sounds clear to auscultation  Pulmonary exam normal       Cardiovascular Exercise Tolerance: Good negative cardio ROS  Rhythm:Regular Rate:Normal     Neuro/Psych negative neurological ROS  negative psych ROS   GI/Hepatic Neg liver ROS, GERD-  ,  Endo/Other  negative endocrine ROS  Renal/GU negative Renal ROS  negative genitourinary   Musculoskeletal negative musculoskeletal ROS (+)   Abdominal   Peds negative pediatric ROS (+)  Hematology negative hematology ROS (+) Family h/o factor V deficiency.   Anesthesia Other Findings   Reproductive/Obstetrics negative OB ROS                           Anesthesia Physical Anesthesia Plan  ASA: I and emergent  Anesthesia Plan: General   Post-op Pain Management:    Induction: Intravenous  Airway Management Planned: Oral ETT  Additional Equipment:   Intra-op Plan:   Post-operative Plan: Extubation in OR  Informed Consent: I have reviewed the patients History and Physical, chart, labs and discussed the procedure including the risks, benefits and alternatives for the proposed anesthesia with the patient or authorized representative who has indicated his/her understanding and acceptance.   Dental advisory given  Plan Discussed with: CRNA  Anesthesia Plan Comments:         Anesthesia Quick Evaluation

## 2014-01-09 NOTE — Progress Notes (Signed)
ANTIBIOTIC CONSULT NOTE - INITIAL  Pharmacy Consult for Zosyn Indication: Appendicitis  Allergies  Allergen Reactions  . Amoxicillin     Stomach cramps  . Codeine Nausea And Vomiting    Stomach cramps   Patient Measurements: Weight: 207 lb (93.895 kg)  Vital Signs: Temp: 97.8 F (36.6 C) (08/24 1600) Temp src: Oral (08/24 1600) BP: 152/88 mmHg (08/24 1600) Pulse Rate: 94 (08/24 1600) Intake/Output from previous day:   Intake/Output from this shift:    Labs:  Recent Labs  01/09/14 1621  WBC 14.9*  HGB 16.1  PLT 230  CREATININE 0.88   The CrCl is unknown because both a height and weight (above a minimum accepted value) are required for this calculation. No results found for this basename: VANCOTROUGH, VANCOPEAK, VANCORANDOM, GENTTROUGH, GENTPEAK, GENTRANDOM, TOBRATROUGH, TOBRAPEAK, TOBRARND, AMIKACINPEAK, AMIKACINTROU, AMIKACIN,  in the last 72 hours   Microbiology: No results found for this or any previous visit (from the past 720 hour(s)).  Medical History: Past Medical History  Diagnosis Date  . Hyperlipidemia   . DJD (degenerative joint disease)   . PAC (premature atrial contraction)   . GERD (gastroesophageal reflux disease)     resolved with diet change, no meds for 3 months  . Impingement syndrome of left shoulder 07/18/2011  . Partial tear subscapularis tendon 07/18/2011  . Family history of factor V deficiency    Medications:  Scheduled:   Anti-infectives   Start     Dose/Rate Route Frequency Ordered Stop   01/10/14 0100  piperacillin-tazobactam (ZOSYN) IVPB 3.375 g     3.375 g 12.5 mL/hr over 240 Minutes Intravenous Every 8 hours 01/09/14 1800     01/09/14 1730  piperacillin-tazobactam (ZOSYN) IVPB 3.375 g     3.375 g 12.5 mL/hr over 240 Minutes Intravenous  Once 01/09/14 1715       Assessment: 47 yoM with RLQ pain,appendicitis on CT, Zosyn begun in ED, planning surgery.  Zosyn per pharmacy  Goal of Therapy:  Abx dose/schedule appropriate  for renal function, clinical course  Plan:   Zosyn 3.375gm q8hr- 4 hr infusion  Otho Bellows PharmD Pager [phone] 01/09/2014, 6:03 PM

## 2014-01-09 NOTE — Transfer of Care (Signed)
Immediate Anesthesia Transfer of Care Note  Patient: Larry Velez  Procedure(s) Performed: Procedure(s) (LRB): APPENDECTOMY LAPAROSCOPIC (N/A)  Patient Location: PACU  Anesthesia Type: General  Level of Consciousness: sedated, patient cooperative and responds to stimulation  Airway & Oxygen Therapy: Patient Spontanous Breathing and Patient connected to face mask oxgen  Post-op Assessment: Report given to PACU RN and Post -op Vital signs reviewed and stable  Post vital signs: Reviewed and stable  Complications: No apparent anesthesia complications

## 2014-01-09 NOTE — ED Notes (Signed)
Last food/ drink yesterday except for CT contrast prior to CT earlier today.

## 2014-01-09 NOTE — ED Notes (Signed)
OR called for patient 

## 2014-01-10 ENCOUNTER — Encounter (HOSPITAL_COMMUNITY): Payer: Self-pay | Admitting: General Surgery

## 2014-01-10 ENCOUNTER — Encounter: Payer: Self-pay | Admitting: General Surgery

## 2014-01-10 MED ORDER — OXYCODONE-ACETAMINOPHEN 5-325 MG PO TABS: 1.0000 | ORAL_TABLET | ORAL | Status: DC | PRN

## 2014-01-10 NOTE — Progress Notes (Addendum)
Discharge instructions and prescriptions,  Given to patient. Walked to car with nursing student

## 2014-01-10 NOTE — Progress Notes (Signed)
I've interviewed and examined this patient this morning. He is doing very well and is ready to go home. Agree with discharge and followup plans.  Angelia Mould. Derrell Lolling, M.D., [facility] General and Minimally invasive Surgery Breast and Colorectal Surgery Office:   [phone] Pager:   [phone]

## 2014-01-10 NOTE — Anesthesia Postprocedure Evaluation (Signed)
  Anesthesia Post-op Note  Patient: Larry Velez  Procedure(s) Performed: Procedure(s) (LRB): APPENDECTOMY LAPAROSCOPIC (N/A)  Patient Location: PACU  Anesthesia Type: General  Level of Consciousness: awake and alert   Airway and Oxygen Therapy: Patient Spontanous Breathing  Post-op Pain: mild  Post-op Assessment: Post-op Vital signs reviewed, Patient's Cardiovascular Status Stable, Respiratory Function Stable, Patent Airway and No signs of Nausea or vomiting  Last Vitals:  Filed Vitals:   01/10/14 0545  BP: 106/58  Pulse: 97  Temp: 36.9 C  Resp: 18    Post-op Vital Signs: stable   Complications: No apparent anesthesia complications

## 2014-01-10 NOTE — Discharge Instructions (Signed)
Laparoscopic Appendectomy °Care After °Refer to this sheet in the next few weeks. These instructions provide you with information on caring for yourself after your procedure. Your caregiver may also give you more specific instructions. Your treatment has been planned according to current medical practices, but problems sometimes occur. Call your caregiver if you have any problems or questions after your procedure. °HOME CARE INSTRUCTIONS °· Do not drive while taking narcotic pain medicines. °· Use stool softener if you become constipated from your pain medicines. °· Change your bandages (dressings) as directed. °· Keep your wounds clean and dry. You may wash the wounds gently with soap and water. Gently pat the wounds dry with a clean towel. °· Do not take baths, swim, or use hot tubs for 10 days, or as instructed by your caregiver. °· Only take over-the-counter or prescription medicines for pain, discomfort, or fever as directed by your caregiver. °· You may continue your normal diet as directed. °· Do not lift more than 10 pounds (4.5 kg) or play contact sports for 3 weeks, or as directed. °· Slowly increase your activity after surgery. °· Take deep breaths to avoid getting a lung infection (pneumonia). °SEEK MEDICAL CARE IF: °· You have redness, swelling, or increasing pain in your wounds. °· You have pus coming from your wounds. °· You have drainage from a wound that lasts longer than 1 day. °· You notice a bad smell coming from the wounds or dressing. °· Your wound edges break open after stitches (sutures) have been removed. °· You notice increasing pain in the shoulders (shoulder strap areas) or near your shoulder blades. °· You develop dizzy episodes or fainting while standing. °· You develop shortness of breath. °· You develop persistent nausea or vomiting. °· You cannot control your bowel functions or lose your appetite. °· You develop diarrhea. °SEEK IMMEDIATE MEDICAL CARE IF:  °· You have a fever. °· You  develop a rash. °· You have difficulty breathing or sharp pains in your chest. °· You develop any reaction or side effects to medicines given. °MAKE SURE YOU: °· Understand these instructions. °· Will watch your condition. °· Will get help right away if you are not doing well or get worse. °Document Released: 05/05/2005 Document Revised: 07/28/2011 Document Reviewed: 11/12/2010 °ExitCare® Patient Information ©2015 [employer]. This information is not intended to replace advice given to you by your health care provider. Make sure you discuss any questions you have with your health care provider. °[facility] °LAPAROSCOPIC SURGERY: POST OP INSTRUCTIONS °Always review your discharge instruction sheet given to you by the facility where your surgery was performed. °IF YOU HAVE DISABILITY OR FAMILY LEAVE FORMS, YOU MUST BRING THEM TO THE OFFICE FOR PROCESSING.   °DO NOT GIVE THEM TO YOUR DOCTOR. ° °1. A prescription for pain medication may be given to you upon discharge.  Take your pain medication as prescribed, if needed.  If narcotic pain medicine is not needed, then you may take acetaminophen (Tylenol) or ibuprofen (Advil) as needed. °2. Take your usually prescribed medications unless otherwise directed. °3. If you need a refill on your pain medication, please contact your pharmacy.  They will contact our office to request authorization. Prescriptions will not be filled after 5pm or on week-ends. °4. You should follow a light diet the first few days after arrival home, such as soup and crackers, etc.  Be sure to include lots of fluids daily. °5. Most patients will experience some swelling and bruising   in the area of the incisions.  Ice packs will help.  Swelling and bruising can take several days to resolve.  °6. It is common to experience some constipation if taking pain medication after surgery.  Increasing fluid intake and taking a stool softener (such as Colace) will usually help or  prevent this problem from occurring.  A mild laxative (Milk of Magnesia or Miralax) should be taken according to package instructions if there are no bowel movements after 48 hours. °7. Unless discharge instructions indicate otherwise, you may remove your bandages 24-48 hours after surgery, and you may shower at that time.  You may have steri-strips (small skin tapes) in place directly over the incision.  These strips should be left on the skin for 7-10 days.  If your surgeon used skin glue on the incision, you may shower in 24 hours.  The glue will flake off over the next 2-3 weeks.  Any sutures or staples will be removed at the office during your follow-up visit. °8. ACTIVITIES:  You may resume regular (light) daily activities beginning the next day--such as daily self-care, walking, climbing stairs--gradually increasing activities as tolerated.  You may have sexual intercourse when it is comfortable.  Refrain from any heavy lifting or straining until approved by your doctor. °a. You may drive when you are no longer taking prescription pain medication, you can comfortably wear a seatbelt, and you can safely maneuver your car and apply brakes. °b. RETURN TO WORK:  __________________________________________________________ °9. You should see your doctor in the office for a follow-up appointment approximately 2-3 weeks after your surgery.  Make sure that you call for this appointment within a day or two after you arrive home to insure a convenient appointment time. °10. OTHER INSTRUCTIONS: __________________________________________________________________________________________________________________________ __________________________________________________________________________________________________________________________ °WHEN TO CALL YOUR DOCTOR: °1. Fever over 101.0 °2. Inability to urinate °3. Continued bleeding from incision. °4. Increased pain, redness, or drainage from the incision. °5. Increasing  abdominal pain ° °The clinic staff is available to answer your questions during regular business hours.  Please don’t hesitate to call and ask to speak to one of the nurses for clinical concerns.  If you have a medical emergency, go to the nearest emergency room or call 911.  A surgeon from [facility] is always on call at the hospital. °[address], [address], [place], [place]  [address] ? [address] [address], [place], [place]   [address] °[phone] ? [phone] ? FAX [phone] °Web site: [email or web address] ° °

## 2014-01-10 NOTE — Progress Notes (Signed)
1 Day Post-Op  Subjective: He looks good dressed and ready to go home.  Objective: Vital signs in last 24 hours: Temp:  [97.8 F (36.6 C)-98.8 F (37.1 C)] 98.5 F (36.9 C) (08/25 0545) Pulse Rate:  [88-100] 97 (08/25 0545) Resp:  [13-22] 18 (08/25 0545) BP: (106-153)/(58-93) 106/58 mmHg (08/25 0545) SpO2:  [95 %-100 %] 95 % (08/25 0545) Weight:  [93.895 kg (207 lb)] 93.895 kg (207 lb) (08/24 2216) Last BM Date: 01/09/14 Regular diet Afebrile, VSS No labs Intake/Output from previous day: 08/24 0701 - 08/25 0700 In: 2261.7 [I.V.:2261.7] Out: 675 [Urine:675] Intake/Output this shift: Total I/O In: 628.3 [P.O.:240; I.V.:388.3] Out: 1300 [Urine:1300]  General appearance: alert, cooperative and no distress GI: soft sore some gas pain otherwise fine sites look good.  Lab Results:   Recent Labs  01/09/14 1621  WBC 14.9*  HGB 16.1  HCT 45.5  PLT 230    BMET  Recent Labs  01/09/14 1621  NA 139  K 4.0  CL 100  CO2 26  GLUCOSE 110*  BUN 14  CREATININE 0.88  CALCIUM 10.0   PT/INR No results found for this basename: LABPROT, INR,  in the last 72 hours   Recent Labs Lab 01/09/14 1621  AST 21  ALT 34  ALKPHOS 102  BILITOT 0.9  PROT 7.9  ALBUMIN 4.4     Lipase     Component Value Date/Time   LIPASE 26 01/09/2014 1621     Studies/Results: No results found.  Medications: . ondansetron (ZOFRAN) IV  4 mg Intravenous Once    Assessment/Plan Acute appendicitis with localized peritonitis   APPENDECTOMY LAPAROSCOPIC Mariella Saa 01/09/2014   Hyperlipidaemia PAC GERD Family history of factor V deficiency     Plan:  Home today.   LOS: 1 day    Shreya Lacasse 01/10/2014

## 2014-01-11 NOTE — Discharge Summary (Signed)
Patient seen and evaluated on day of discharge. Agree with discharge summary and plans.  Angelia Mould. Derrell Lolling, M.D., [facility] General and Minimally invasive Surgery Breast and Colorectal Surgery Office:   [phone] Pager:   [phone]

## 2014-01-11 NOTE — Discharge Summary (Signed)
Physician Discharge Summary  Patient ID: Larry Velez MRN: [id number] DOB/AGE: 1966-01-11 48 y.o.  Admit date: 01/09/2014 Discharge date: 01/11/2014  Admission Diagnoses:   Acute appendicitis with localized peritonitis  Hyperlipidaemia  PAC  GERD  Family history of factor V deficiency    Discharge Diagnoses:  Same Active Problems:   Acute appendicitis   PROCEDURES: APPENDECTOMY LAPAROSCOPIC  Mariella Saa 01/09/2014    Hospital Course:  This is a healthy 48 year old Caucasian gentleman who was well until yesterday when he developed nonlocalizing crampy pain. The pain got worse during the night it is now localized to the right lower quadrant. He's been nauseated but has not vomited. He has not had any diarrhea. He is voiding well. WBC 14,900. Chemistries and urinalysis normal. CT scan of the head and pelvis was done to try and imaging of the chemistry. We loaded the disc and I reviewed this with Dr. Ephriam Knuckles on in radiology. The appendix is inflamed but there is no free air and no abscess or free fluid. No other abnormalities were noted. He is being admitted and prepare for appendectomy.  He was admitted and taken to the OR that evening by Dr. Johna Sheriff. He did well post procedure, and was dressed and ready to go home the following AM.    Condition on d/c:  Improved    Disposition: 01-Home or Self Care     Medication List         naproxen sodium 220 MG tablet  Commonly known as:  ANAPROX  Take 220 mg by mouth 2 (two) times daily with a meal.     oxyCODONE-acetaminophen 5-325 MG per tablet  Commonly known as:  PERCOCET/ROXICET  Take 1-2 tablets by mouth every 4 (four) hours as needed for moderate pain.           Follow-up Information   Follow up with HOXWORTH,BENJAMIN T, MD. Schedule an appointment as soon as possible for a visit in 2 weeks.   Specialty:  General Surgery   Contact information:   [address] [place]  [address] [phone]       Signed: Sherrie George 01/11/2014, 10:24 AM

## 2014-01-20 ENCOUNTER — Other Ambulatory Visit (INDEPENDENT_AMBULATORY_CARE_PROVIDER_SITE_OTHER): Payer: Self-pay | Admitting: General Surgery

## 2014-01-20 ENCOUNTER — Inpatient Hospital Stay
Admission: RE | Admit: 2014-01-20 | Discharge: 2014-01-20 | Disposition: A | Discharge status: Home / Self Care | Payer: Self-pay | Source: Ambulatory Visit | Attending: General Surgery | Admitting: General Surgery

## 2014-01-20 DIAGNOSIS — K3580 Unspecified acute appendicitis: Secondary | ICD-10-CM

## 2014-03-01 ENCOUNTER — Other Ambulatory Visit (HOSPITAL_COMMUNITY): Payer: Self-pay | Admitting: Internal Medicine

## 2014-03-01 DIAGNOSIS — N509 Disorder of male genital organs, unspecified: Secondary | ICD-10-CM

## 2014-03-02 ENCOUNTER — Ambulatory Visit (HOSPITAL_COMMUNITY)
Admission: RE | Admit: 2014-03-02 | Discharge: 2014-03-02 | Disposition: A | Discharge status: Home / Self Care | Payer: BC Managed Care – PPO | Source: Ambulatory Visit | Attending: Internal Medicine | Admitting: Internal Medicine

## 2014-03-02 DIAGNOSIS — N509 Disorder of male genital organs, unspecified: Secondary | ICD-10-CM | POA: Diagnosis not present

## 2015-01-16 ENCOUNTER — Other Ambulatory Visit: Payer: Self-pay | Admitting: Internal Medicine

## 2015-01-16 DIAGNOSIS — M5416 Radiculopathy, lumbar region: Secondary | ICD-10-CM

## 2015-01-24 ENCOUNTER — Ambulatory Visit
Admission: RE | Admit: 2015-01-24 | Discharge: 2015-01-24 | Disposition: A | Discharge status: Home / Self Care | Payer: BLUE CROSS/BLUE SHIELD | Source: Ambulatory Visit | Attending: Internal Medicine | Admitting: Internal Medicine

## 2015-01-24 DIAGNOSIS — M5416 Radiculopathy, lumbar region: Secondary | ICD-10-CM

## 2015-03-14 ENCOUNTER — Other Ambulatory Visit: Payer: Self-pay | Admitting: Neurosurgery

## 2015-03-14 DIAGNOSIS — M5441 Lumbago with sciatica, right side: Secondary | ICD-10-CM

## 2015-03-21 ENCOUNTER — Ambulatory Visit
Admission: RE | Admit: 2015-03-21 | Discharge: 2015-03-21 | Disposition: A | Discharge status: Home / Self Care | Payer: BLUE CROSS/BLUE SHIELD | Source: Ambulatory Visit | Attending: Neurosurgery | Admitting: Neurosurgery

## 2015-03-21 DIAGNOSIS — M5441 Lumbago with sciatica, right side: Secondary | ICD-10-CM

## 2015-03-21 MED ORDER — METHYLPREDNISOLONE ACETATE 40 MG/ML INJ SUSP (RADIOLOG
120.0000 mg | Freq: Once | INTRAMUSCULAR | Status: DC
Start: 2015-03-21 — End: 2015-03-22

## 2015-03-21 MED ORDER — IOHEXOL 180 MG/ML  SOLN: 1.0000 mL | Freq: Once | INTRAMUSCULAR | Status: DC | PRN

## 2015-03-21 NOTE — Discharge Instructions (Signed)

## 2015-04-03 ENCOUNTER — Other Ambulatory Visit: Payer: Self-pay | Admitting: Neurosurgery

## 2015-04-03 DIAGNOSIS — M5441 Lumbago with sciatica, right side: Secondary | ICD-10-CM

## 2015-04-10 ENCOUNTER — Ambulatory Visit
Admission: RE | Admit: 2015-04-10 | Discharge: 2015-04-10 | Disposition: A | Discharge status: Home / Self Care | Payer: BLUE CROSS/BLUE SHIELD | Source: Ambulatory Visit | Attending: Neurosurgery | Admitting: Neurosurgery

## 2015-04-10 DIAGNOSIS — M5441 Lumbago with sciatica, right side: Secondary | ICD-10-CM

## 2015-04-10 MED ORDER — METHYLPREDNISOLONE ACETATE 40 MG/ML INJ SUSP (RADIOLOG
120.0000 mg | Freq: Once | INTRAMUSCULAR | Status: AC
  Administered 2015-04-10: 120 mg via EPIDURAL

## 2015-04-10 MED ORDER — IOHEXOL 180 MG/ML  SOLN
1.0000 mL | Freq: Once | INTRAMUSCULAR | Status: DC | PRN
  Administered 2015-04-10: 1 mL via EPIDURAL

## 2021-05-06 DIAGNOSIS — G4733 Obstructive sleep apnea (adult) (pediatric): Secondary | ICD-10-CM | POA: Diagnosis not present

## 2021-06-06 DIAGNOSIS — G4733 Obstructive sleep apnea (adult) (pediatric): Secondary | ICD-10-CM | POA: Diagnosis not present

## 2021-07-07 DIAGNOSIS — G4733 Obstructive sleep apnea (adult) (pediatric): Secondary | ICD-10-CM | POA: Diagnosis not present

## 2021-07-29 DIAGNOSIS — E785 Hyperlipidemia, unspecified: Secondary | ICD-10-CM | POA: Diagnosis not present

## 2021-07-29 DIAGNOSIS — G43909 Migraine, unspecified, not intractable, without status migrainosus: Secondary | ICD-10-CM | POA: Diagnosis not present

## 2021-07-29 DIAGNOSIS — E559 Vitamin D deficiency, unspecified: Secondary | ICD-10-CM | POA: Diagnosis not present

## 2021-07-29 DIAGNOSIS — G4733 Obstructive sleep apnea (adult) (pediatric): Secondary | ICD-10-CM | POA: Diagnosis not present

## 2021-07-29 DIAGNOSIS — Z0001 Encounter for general adult medical examination with abnormal findings: Secondary | ICD-10-CM | POA: Diagnosis not present

## 2021-07-29 DIAGNOSIS — I10 Essential (primary) hypertension: Secondary | ICD-10-CM | POA: Diagnosis not present

## 2021-07-29 LAB — LAB REPORT - SCANNED
A1c: 5.7
EGFR: 91

## 2021-08-04 DIAGNOSIS — G4733 Obstructive sleep apnea (adult) (pediatric): Secondary | ICD-10-CM | POA: Diagnosis not present

## 2021-09-04 DIAGNOSIS — G4733 Obstructive sleep apnea (adult) (pediatric): Secondary | ICD-10-CM | POA: Diagnosis not present

## 2021-10-04 DIAGNOSIS — G4733 Obstructive sleep apnea (adult) (pediatric): Secondary | ICD-10-CM | POA: Diagnosis not present

## 2021-11-04 DIAGNOSIS — G4733 Obstructive sleep apnea (adult) (pediatric): Secondary | ICD-10-CM | POA: Diagnosis not present

## 2021-12-04 DIAGNOSIS — G4733 Obstructive sleep apnea (adult) (pediatric): Secondary | ICD-10-CM | POA: Diagnosis not present

## 2022-01-04 DIAGNOSIS — G4733 Obstructive sleep apnea (adult) (pediatric): Secondary | ICD-10-CM | POA: Diagnosis not present

## 2022-01-13 DIAGNOSIS — Z0001 Encounter for general adult medical examination with abnormal findings: Secondary | ICD-10-CM | POA: Diagnosis not present

## 2022-01-13 DIAGNOSIS — E785 Hyperlipidemia, unspecified: Secondary | ICD-10-CM | POA: Diagnosis not present

## 2022-01-13 DIAGNOSIS — Z23 Encounter for immunization: Secondary | ICD-10-CM | POA: Diagnosis not present

## 2022-01-13 DIAGNOSIS — I10 Essential (primary) hypertension: Secondary | ICD-10-CM | POA: Diagnosis not present

## 2022-01-13 DIAGNOSIS — G43909 Migraine, unspecified, not intractable, without status migrainosus: Secondary | ICD-10-CM | POA: Diagnosis not present

## 2022-01-13 DIAGNOSIS — N320 Bladder-neck obstruction: Secondary | ICD-10-CM | POA: Diagnosis not present

## 2022-01-13 DIAGNOSIS — E559 Vitamin D deficiency, unspecified: Secondary | ICD-10-CM | POA: Diagnosis not present

## 2022-01-13 DIAGNOSIS — N529 Male erectile dysfunction, unspecified: Secondary | ICD-10-CM | POA: Diagnosis not present

## 2022-01-13 LAB — LAB REPORT - SCANNED
A1c: 5.7
EGFR: 88

## 2022-02-11 DIAGNOSIS — G4733 Obstructive sleep apnea (adult) (pediatric): Secondary | ICD-10-CM | POA: Diagnosis not present

## 2022-03-13 DIAGNOSIS — G4733 Obstructive sleep apnea (adult) (pediatric): Secondary | ICD-10-CM | POA: Diagnosis not present

## 2022-03-26 DIAGNOSIS — L821 Other seborrheic keratosis: Secondary | ICD-10-CM | POA: Diagnosis not present

## 2022-03-26 DIAGNOSIS — L814 Other melanin hyperpigmentation: Secondary | ICD-10-CM | POA: Diagnosis not present

## 2022-03-26 DIAGNOSIS — D225 Melanocytic nevi of trunk: Secondary | ICD-10-CM | POA: Diagnosis not present

## 2022-04-13 DIAGNOSIS — G4733 Obstructive sleep apnea (adult) (pediatric): Secondary | ICD-10-CM | POA: Diagnosis not present

## 2022-07-14 DIAGNOSIS — I10 Essential (primary) hypertension: Secondary | ICD-10-CM | POA: Diagnosis not present

## 2022-07-14 DIAGNOSIS — J019 Acute sinusitis, unspecified: Secondary | ICD-10-CM | POA: Diagnosis not present

## 2022-09-03 ENCOUNTER — Encounter: Payer: Self-pay | Admitting: Family Medicine

## 2022-09-03 ENCOUNTER — Ambulatory Visit (INDEPENDENT_AMBULATORY_CARE_PROVIDER_SITE_OTHER): Payer: BC Managed Care – PPO | Admitting: Family Medicine

## 2022-09-03 VITALS — BP 122/80 | HR 80 | Temp 98.3°F | Ht 70.0 in | Wt 229.0 lb

## 2022-09-03 DIAGNOSIS — M51379 Other intervertebral disc degeneration, lumbosacral region without mention of lumbar back pain or lower extremity pain: Secondary | ICD-10-CM

## 2022-09-03 DIAGNOSIS — I10 Essential (primary) hypertension: Secondary | ICD-10-CM

## 2022-09-03 DIAGNOSIS — E782 Mixed hyperlipidemia: Secondary | ICD-10-CM

## 2022-09-03 DIAGNOSIS — Z1159 Encounter for screening for other viral diseases: Secondary | ICD-10-CM | POA: Diagnosis not present

## 2022-09-03 DIAGNOSIS — E669 Obesity, unspecified: Secondary | ICD-10-CM | POA: Diagnosis not present

## 2022-09-03 DIAGNOSIS — Z6832 Body mass index (BMI) 32.0-32.9, adult: Secondary | ICD-10-CM | POA: Diagnosis not present

## 2022-09-03 DIAGNOSIS — M5137 Other intervertebral disc degeneration, lumbosacral region: Secondary | ICD-10-CM

## 2022-09-03 DIAGNOSIS — Z0001 Encounter for general adult medical examination with abnormal findings: Secondary | ICD-10-CM

## 2022-09-03 DIAGNOSIS — Z114 Encounter for screening for human immunodeficiency virus [HIV]: Secondary | ICD-10-CM

## 2022-09-03 DIAGNOSIS — Z125 Encounter for screening for malignant neoplasm of prostate: Secondary | ICD-10-CM

## 2022-09-03 DIAGNOSIS — E66811 Obesity, class 1: Secondary | ICD-10-CM | POA: Insufficient documentation

## 2022-09-03 DIAGNOSIS — Z7689 Persons encountering health services in other specified circumstances: Secondary | ICD-10-CM | POA: Insufficient documentation

## 2022-09-03 DIAGNOSIS — Z1211 Encounter for screening for malignant neoplasm of colon: Secondary | ICD-10-CM

## 2022-09-03 LAB — CBC WITH DIFFERENTIAL/PLATELET
Basophils Relative: 0.6 %
HCT: 46.4 % (ref 38.5–50.0)
Hemoglobin: 15.5 g/dL (ref 13.2–17.1)
MCH: 30.9 pg (ref 27.0–33.0)
Neutrophils Relative %: 51.4 %

## 2022-09-03 MED ORDER — METHOCARBAMOL 750 MG PO TABS: 750.0000 mg | ORAL_TABLET | Freq: Four times a day (QID) | ORAL | 0 refills | Status: DC

## 2022-09-03 MED ORDER — NEBIVOLOL HCL 10 MG PO TABS: 10.0000 mg | ORAL_TABLET | Freq: Every day | ORAL | 1 refills | Status: DC

## 2022-09-03 MED ORDER — PRAVASTATIN SODIUM 40 MG PO TABS: 40.0000 mg | ORAL_TABLET | Freq: Every day | ORAL | 1 refills | Status: DC

## 2022-09-03 NOTE — Assessment & Plan Note (Signed)
BMI 32.86. Encourage heart healthy diet and 150 minutes moderate intensity exercise weekly.

## 2022-09-03 NOTE — Assessment & Plan Note (Signed)
Fasting labs done today. Continue Pravastatin  daily. Encourage heart healthy diet and 150 minutes moderate intensity exercise weekly.

## 2022-09-03 NOTE — Progress Notes (Signed)
New Patient Office Visit  Subjective    Patient ID: Larry Velez, male    DOB: August 11, 1965  Age: 57 y.o. MRN: [id number]  CC: No chief complaint on file.   HPI Larry Velez presents to establish care. Oriented to practice routines and expectations. PMH includes HTN, HLD, and allergies. He as been out of his Losartan since the beginning of the month with BP readings 103-120/74-82 in AM and 112-152/77-93. He has also started a CPAP. 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 He has had imaging and spoken to a Careers adviser. He endorses mild back discomfort daily and exacerbations with work or lifting every occasionally that causes muscle spasms and bilateral low back pain, worse on the right with left thigh numbness. Denies saddle numbness, urinary incontinence, fecal incontinence.  Colon CA screening: colonoscopy 7 years ago without abnormalities. Prostate CA screening: Agrees to PSA testing Tobacco: never smoker STI: declines Vaccines:  UTD  HYPERTENSION without Chronic Kidney Disease Hypertension status: controlled  Satisfied with current treatment? yes Duration of hypertension: chronic BP monitoring frequency:  daily BP range:  BP medication side effects:  no Medication compliance: excellent compliance Previous BP meds:losartan (cozaar) and Nebivolol Aspirin: no Recurrent headaches: no Visual changes: no Palpitations: no Dyspnea: no Chest pain: no Lower extremity edema: no Dizzy/lightheaded: no   Outpatient Encounter Medications as of 09/03/2022  Medication Sig   rizatriptan (MAXALT-MLT) 5 MG disintegrating tablet Take 5 mg by mouth as needed for migraine. May repeat in 2 hours if needed   sildenafil (REVATIO) 20 MG tablet Take 20 mg by mouth 3 (three) times daily.   valACYclovir (VALTREX) 500 MG tablet Take 500 mg by mouth 2 (two) times daily. PRN per pt   [DISCONTINUED] losartan (COZAAR) 50 MG tablet Take 50 mg by mouth daily.   [DISCONTINUED] methocarbamol  (ROBAXIN) 750 MG tablet Take 750 mg by mouth 4 (four) times daily.   [DISCONTINUED] naproxen sodium (ANAPROX) 220 MG tablet Take 220 mg by mouth 2 (two) times daily with a meal.   [DISCONTINUED] nebivolol (BYSTOLIC) 10 MG tablet Take 10 mg by mouth daily.   [DISCONTINUED] pravastatin (PRAVACHOL) 40 MG tablet Take 40 mg by mouth daily.   methocarbamol (ROBAXIN) 750 MG tablet Take 1 tablet (750 mg total) by mouth 4 (four) times daily.   nebivolol (BYSTOLIC) 10 MG tablet Take 1 tablet (10 mg total) by mouth daily.   pravastatin (PRAVACHOL) 40 MG tablet Take 1 tablet (40 mg total) by mouth daily.   [DISCONTINUED] oxyCODONE-acetaminophen (PERCOCET/ROXICET) 5-325 MG per tablet Take 1-2 tablets by mouth every 4 (four) hours as needed for moderate pain. (Patient not taking: Reported on 09/03/2022)   No facility-administered encounter medications on file as of 09/03/2022.    Past Medical History:  Diagnosis Date   DJD (degenerative joint disease)    Family history of factor V deficiency    GERD (gastroesophageal reflux disease)    resolved with diet change, no meds for 3 months   Hyperlipidemia    Impingement syndrome of left shoulder 07/18/2011   PAC (premature atrial contraction)    Partial tear subscapularis tendon 07/18/2011    Past Surgical History:  Procedure Laterality Date   APPENDECTOMY     COSMETIC SURGERY  05/19/1970   On both ears   EYE SURGERY     lasik   HEMORROIDECTOMY  4 years ago   LAPAROSCOPIC APPENDECTOMY N/A 01/09/2014   Procedure: APPENDECTOMY LAPAROSCOPIC;  Surgeon: Mariella Saa, MD;  Location:  [facility];  Service: General;  Laterality: N/A;   left shoulder surgery     SHOULDER SURGERY      Family History  Problem Relation Age of Onset   Clotting disorder Sister    Heart disease Sister        bicuspid AV    Social History   Socioeconomic History   Marital status: Married    Spouse name: Not on file   Number of children: 2   Years of education: Not on file    Highest education level: Not on file  Occupational History   Occupation: soft ware    Employer: [employer]  Tobacco Use   Smoking status: Never   Smokeless tobacco: Never  Substance and Sexual Activity   Alcohol use: Yes    Comment: 1 or 2 bottles beer per week   Drug use: No   Sexual activity: Yes  Other Topics Concern   Not on file  Social History Narrative   Not on file   Social Determinants of Health   Financial Resource Strain: Not on file  Food Insecurity: Not on file  Transportation Needs: Not on file  Physical Activity: Not on file  Stress: Not on file  Social Connections: Not on file  Intimate Partner Violence: Not on file    Review of Systems  Constitutional: Negative.   HENT: Negative.    Eyes: Negative.   Respiratory: Negative.    Cardiovascular: Negative.   Gastrointestinal: Negative.   Genitourinary: Negative.   Musculoskeletal:  Positive for back pain.  Skin: Negative.   Neurological: Negative.   Endo/Heme/Allergies: Negative.   Psychiatric/Behavioral: Negative.    All other systems reviewed and are negative.       Objective    BP 122/80   Pulse 80   Temp 98.3 F (36.8 C) (Oral)   Ht 5\' 10"  (1.778 m)   Wt 229 lb (103.9 kg)   SpO2 99%   BMI 32.86 kg/m   Physical Exam Vitals and nursing note reviewed.  Constitutional:      Appearance: Normal appearance. He is normal weight.  HENT:     Head: Normocephalic and atraumatic.     Right Ear: Tympanic membrane, ear canal and external ear normal.     Left Ear: Tympanic membrane, ear canal and external ear normal.     Nose: Nose normal.     Mouth/Throat:     Mouth: Mucous membranes are moist.     Pharynx: Oropharynx is clear.  Eyes:     Extraocular Movements: Extraocular movements intact.     Right eye: Normal extraocular motion and no nystagmus.     Left eye: Normal extraocular motion and no nystagmus.     Conjunctiva/sclera: Conjunctivae normal.     Pupils: Pupils are  equal, round, and reactive to light.  Cardiovascular:     Rate and Rhythm: Normal rate and regular rhythm.     Pulses: Normal pulses.     Heart sounds: Normal heart sounds.  Pulmonary:     Effort: Pulmonary effort is normal.     Breath sounds: Normal breath sounds.  Abdominal:     General: Bowel sounds are normal.     Palpations: Abdomen is soft.  Genitourinary:    Comments: Deferred using shared decision making Musculoskeletal:        General: Normal range of motion.     Cervical back: Normal range of motion and neck supple.     Thoracic back: No bony tenderness.  Skin:    General: Skin is warm and dry.     Capillary Refill: Capillary refill takes less than 2 seconds.  Neurological:     General: No focal deficit present.     Mental Status: He is alert. Mental status is at baseline.  Psychiatric:        Mood and Affect: Mood normal.        Speech: Speech normal.        Behavior: Behavior normal.        Thought Content: Thought content normal.        Cognition and Memory: Cognition and memory normal.        Judgment: Judgment normal.     Last CBC Lab Results  Component Value Date   WBC 14.9 (H) 01/09/2014   HGB 16.1 01/09/2014   HCT 45.5 01/09/2014   MCV 90.1 01/09/2014   MCH 31.9 01/09/2014   RDW 12.1 01/09/2014   PLT 230 01/09/2014   Last metabolic panel Lab Results  Component Value Date   GLUCOSE 110 (H) 01/09/2014   NA 139 01/09/2014   K 4.0 01/09/2014   CL 100 01/09/2014   CO2 26 01/09/2014   BUN 14 01/09/2014   CREATININE 0.88 01/09/2014   GFRNONAA >90 01/09/2014   CALCIUM 10.0 01/09/2014   PROT 7.9 01/09/2014   ALBUMIN 4.4 01/09/2014   BILITOT 0.9 01/09/2014   ALKPHOS 102 01/09/2014   AST 21 01/09/2014   ALT 34 01/09/2014   ANIONGAP 13 01/09/2014   Last lipids Lab Results  Component Value Date   CHOL 200 06/25/2012   HDL 44.90 06/25/2012   LDLCALC 132 (H) 06/25/2012   TRIG 116.0 06/25/2012   CHOLHDL 4 06/25/2012   Last hemoglobin A1c No  results found for: "HGBA1C" Last thyroid functions No results found for: "TSH", "T3TOTAL", "T4TOTAL", "THYROIDAB" Last vitamin D No results found for: "25OHVITD2", "25OHVITD3", "VD25OH" Last vitamin B12 and Folate No results found for: "VITAMINB12", "FOLATE"      Assessment & Plan:   Problem List Items Addressed This Visit       Cardiovascular and Mediastinum   Primary hypertension    Chronic. Well controlled. Goal <130/80. Continue Nebivolol 10mg  daily and home monitoring. Seek medical care for chest pain, palpitations, shortness of breath, vision changes, recurrent headaches, or swelling of extremities. Encourage heart healthy diet and 150 minutes of moderate intensity exercise weekly. Follow up in 6 months.      Relevant Medications   sildenafil (REVATIO) 20 MG tablet   nebivolol (BYSTOLIC) 10 MG tablet   pravastatin (PRAVACHOL) 40 MG tablet   Other Relevant Orders   CBC with Differential/Platelet   COMPLETE METABOLIC PANEL WITH GFR   Lipid panel     Musculoskeletal and Integument   Degenerative disc disease at L5-S1 level    Chronic. Has had imaging and consult with surgery and no plans for intervention at this time. Well controlled with occasional Robaxin, approximately twice yearly. Provided refill for current exacerbation. No saddle numbness, incontinence, or bony tenderness.      Relevant Medications   methocarbamol (ROBAXIN) 750 MG tablet     Other   Hyperlipidemia    Fasting labs done today. Continue Pravastatin 40mg  daily. Encourage heart healthy diet and 150 minutes moderate intensity exercise weekly.      Relevant Medications   sildenafil (REVATIO) 20 MG tablet   nebivolol (BYSTOLIC) 10 MG tablet   pravastatin (PRAVACHOL) 40 MG tablet   Other Relevant Orders   Lipid panel  Encounter to establish care with new doctor - Primary    Today your medical history was reviewed and routine physical exam with labs was performed. Recommend 150 minutes of moderate  intensity exercise weekly and consuming a well-balanced diet. Advised to stop smoking if a smoker, avoid smoking if a non-smoker, limit alcohol consumption to 1 drink per day for women and 2 drinks per day for men, and avoid illicit drug use. Counseled on safe sex practices and offered STI testing today. Counseled in mental health awareness and when to seek medical care. Vaccine maintenance discussed. Appropriate health maintenance items reviewed. Return to office in 1 year for annual physical exam.       Class 1 obesity with serious comorbidity and body mass index (BMI) of 32.0 to 32.9 in adult    BMI 32.86. Encourage heart healthy diet and 150 minutes moderate intensity exercise weekly.      Relevant Orders   CBC with Differential/Platelet   COMPLETE METABOLIC PANEL WITH GFR   Lipid panel   Other Visit Diagnoses     Colon cancer screening       Relevant Orders   Cologuard   Prostate cancer screening       Relevant Orders   PSA   Screening for HIV (human immunodeficiency virus)       Relevant Orders   HIV Antibody (routine testing w rflx)   Need for hepatitis C screening test       Relevant Orders   Hepatitis C antibody       Return in about 6 months (around 03/05/2023).   Park Meo, FNP

## 2022-09-03 NOTE — Assessment & Plan Note (Signed)
Chronic. Well controlled. Goal <130/80. Continue Nebivolol  daily and home monitoring. Seek medical care for chest pain, palpitations, shortness of breath, vision changes, recurrent headaches, or swelling of extremities. Encourage heart healthy diet and 150 minutes of moderate intensity exercise weekly. Follow up in 6 months.

## 2022-09-03 NOTE — Assessment & Plan Note (Addendum)
Today your medical history was reviewed and routine physical exam with labs was performed. Recommend 150 minutes of moderate intensity exercise weekly and consuming a well-balanced diet. Advised to stop smoking if a smoker, avoid smoking if a non-smoker, limit alcohol consumption to 1 drink per day for women and 2 drinks per day for men, and avoid illicit drug use. Counseled on safe sex practices and offered STI testing today. Counseled in mental health awareness and when to seek medical care. Vaccine maintenance discussed. Appropriate health maintenance items reviewed. Return to office in 1 year for annual physical exam.  

## 2022-09-03 NOTE — Patient Instructions (Signed)
It was great to meet you today and I'm excited to have you join the [facility] practice. I hope you had a positive experience today! If you feel so inclined, please feel free to recommend our practice to friends and family. Harjit Leider, FNP-C  

## 2022-09-03 NOTE — Assessment & Plan Note (Addendum)
Chronic. Has had imaging and consult with surgery and no plans for intervention at this time. Well controlled with occasional Robaxin, approximately twice yearly. Provided refill for current exacerbation. No saddle numbness, incontinence, or bony tenderness.

## 2022-09-04 LAB — COMPLETE METABOLIC PANEL WITH GFR
AG Ratio: 1.8 (calc) (ref 1.0–2.5)
ALT: 34 U/L (ref 9–46)
AST: 23 U/L (ref 10–35)
Albumin: 4.5 g/dL (ref 3.6–5.1)
Alkaline phosphatase (APISO): 85 U/L (ref 35–144)
BUN: 20 mg/dL (ref 7–25)
CO2: 28 mmol/L (ref 20–32)
Calcium: 9.9 mg/dL (ref 8.6–10.3)
Chloride: 104 mmol/L (ref 98–110)
Creat: 1.03 mg/dL (ref 0.70–1.30)
Globulin: 2.5 g/dL (calc) (ref 1.9–3.7)
Glucose, Bld: 88 mg/dL (ref 65–99)
Potassium: 4.6 mmol/L (ref 3.5–5.3)
Sodium: 141 mmol/L (ref 135–146)
Total Bilirubin: 0.5 mg/dL (ref 0.2–1.2)
Total Protein: 7 g/dL (ref 6.1–8.1)
eGFR: 85 mL/min/{1.73_m2} (ref >=60)

## 2022-09-04 LAB — PSA: PSA: 1.65 ng/mL (ref <=4.00)

## 2022-09-04 LAB — CBC WITH DIFFERENTIAL/PLATELET
Absolute Monocytes: 435 cells/uL (ref 200–950)
Basophils Absolute: 41 cells/uL (ref 0–200)
Eosinophils Absolute: 297 cells/uL (ref 15–500)
Eosinophils Relative: 4.3 %
Lymphs Abs: 2581 cells/uL (ref 850–3900)
MCHC: 33.4 g/dL (ref 32.0–36.0)
MCV: 92.6 fL (ref 80.0–100.0)
MPV: 9.7 fL (ref 7.5–12.5)
Monocytes Relative: 6.3 %
Neutro Abs: 3547 cells/uL (ref 1500–7800)
Platelets: 231 10*3/uL (ref 140–400)
RBC: 5.01 10*6/uL (ref 4.20–5.80)
RDW: 11.8 % (ref 11.0–15.0)
Total Lymphocyte: 37.4 %
WBC: 6.9 10*3/uL (ref 3.8–10.8)

## 2022-09-04 LAB — HIV ANTIBODY (ROUTINE TESTING W REFLEX): HIV 1&2 Ab, 4th Generation: NONREACTIVE

## 2022-09-04 LAB — LIPID PANEL
Cholesterol: 178 mg/dL (ref <200)
HDL: 56 mg/dL (ref >=40)
LDL Cholesterol (Calc): 98 mg/dL (calc)
Non-HDL Cholesterol (Calc): 122 mg/dL (calc) (ref <130)
Total CHOL/HDL Ratio: 3.2 (calc) (ref <5.0)
Triglycerides: 144 mg/dL (ref <150)

## 2022-09-04 LAB — HEPATITIS C ANTIBODY: Hepatitis C Ab: NONREACTIVE

## 2022-09-15 DIAGNOSIS — Z1211 Encounter for screening for malignant neoplasm of colon: Secondary | ICD-10-CM | POA: Insufficient documentation

## 2022-09-15 DIAGNOSIS — Z83719 Family history of colon polyps, unspecified: Secondary | ICD-10-CM | POA: Insufficient documentation

## 2022-09-16 DIAGNOSIS — Z1211 Encounter for screening for malignant neoplasm of colon: Secondary | ICD-10-CM | POA: Diagnosis not present

## 2022-09-24 LAB — COLOGUARD: COLOGUARD: NEGATIVE

## 2022-12-27 DIAGNOSIS — H109 Unspecified conjunctivitis: Secondary | ICD-10-CM | POA: Diagnosis not present

## 2022-12-27 DIAGNOSIS — Z6834 Body mass index (BMI) 34.0-34.9, adult: Secondary | ICD-10-CM | POA: Diagnosis not present

## 2022-12-27 DIAGNOSIS — I10 Essential (primary) hypertension: Secondary | ICD-10-CM | POA: Diagnosis not present

## 2022-12-27 DIAGNOSIS — E669 Obesity, unspecified: Secondary | ICD-10-CM | POA: Diagnosis not present

## 2022-12-29 DIAGNOSIS — S0502XA Injury of conjunctiva and corneal abrasion without foreign body, left eye, initial encounter: Secondary | ICD-10-CM | POA: Diagnosis not present

## 2023-03-05 ENCOUNTER — Ambulatory Visit: Payer: BC Managed Care – PPO | Admitting: Family Medicine

## 2023-03-05 ENCOUNTER — Encounter: Payer: Self-pay | Admitting: Family Medicine

## 2023-03-05 VITALS — BP 115/70 | HR 73 | Temp 98.2°F | Ht 70.0 in | Wt 228.0 lb

## 2023-03-05 DIAGNOSIS — I10 Essential (primary) hypertension: Secondary | ICD-10-CM

## 2023-03-05 DIAGNOSIS — E782 Mixed hyperlipidemia: Secondary | ICD-10-CM

## 2023-03-05 DIAGNOSIS — J0110 Acute frontal sinusitis, unspecified: Secondary | ICD-10-CM | POA: Insufficient documentation

## 2023-03-05 MED ORDER — DOXYCYCLINE HYCLATE 100 MG PO TABS
100.0000 mg | ORAL_TABLET | Freq: Two times a day (BID) | ORAL | 0 refills | Status: AC
Start: 2023-03-05 — End: 2023-03-12

## 2023-03-05 MED ORDER — NEBIVOLOL HCL 10 MG PO TABS: 10.0000 mg | ORAL_TABLET | Freq: Every day | ORAL | 1 refills | Status: DC

## 2023-03-05 MED ORDER — PRAVASTATIN SODIUM 40 MG PO TABS: 40.0000 mg | ORAL_TABLET | Freq: Every day | ORAL | 1 refills | Status: DC

## 2023-03-05 NOTE — Assessment & Plan Note (Signed)
Chronic. Well controlled. Goal <130/80. Continue Nebivolol 10mg  daily and home monitoring. Recommend heart healthy diet such as Mediterranean diet with whole grains, fruits, vegetable, fish, lean meats, nuts, and olive oil. Limit salt. Encouraged moderate walking, 3-5 times/week for 30-50 minutes each session. Aim for at least 150 minutes.week. Goal should be pace of 3 miles/hours, or walking 1.5 miles in 30 minutes. Avoid tobacco products. Avoid excess alcohol. Take medications as prescribed and bring medications and blood pressure log with cuff to each office visit. Seek medical care for chest pain, palpitations, shortness of breath with exertion, dizziness/lightheadedness, vision changes, recurrent headaches, or swelling of extremities. Follow up in 6 months.

## 2023-03-05 NOTE — Assessment & Plan Note (Signed)
Patient here today with frontal sinus tenderness and persistent purulent rhinorrhea that is not improving after 7 days. Will order Doxycycline 100mg  BID for 7 days. Return to office if symptoms persist or worsen.

## 2023-03-05 NOTE — Progress Notes (Signed)
Subjective:  HPI: Larry Velez is a 57 y.o. male presenting on 03/05/2023 for Follow-up (6 mos f/u bp/meds)   HPI Patient is in today for   Also complains of cough and congestion for 7 days with sinus pressure and mucolytic rhinorrhea. He denies fever, chills, body aches, shortness of breath, wheezing, loss of taste or smell. Symptoms not improving. Known exposures include his grandson, negative covid test Has tried Mucinex  HYPERTENSION without Chronic Kidney Disease Hypertension status: controlled  Satisfied with current treatment? yes Duration of hypertension: chronic BP monitoring frequency:  a few times a month BP range: 120/80s BP medication side effects:  no Medication compliance: excellent compliance Previous BP meds:nebivolol Aspirin: no Recurrent headaches: no Visual changes: no Palpitations: no Dyspnea: no Chest pain: no Lower extremity edema: no Dizzy/lightheaded: no   Review of Systems  All other systems reviewed and are negative.   Relevant past medical history reviewed and updated as indicated.   Past Medical History:  Diagnosis Date   DJD (degenerative joint disease)    Family history of factor V deficiency    GERD (gastroesophageal reflux disease)    resolved with diet change, no meds for 3 months   Hyperlipidemia    Impingement syndrome of left shoulder 07/18/2011   PAC (premature atrial contraction)    Partial tear subscapularis tendon 07/18/2011     Past Surgical History:  Procedure Laterality Date   APPENDECTOMY     COSMETIC SURGERY  05/19/1970   On both ears   EYE SURGERY     lasik   HEMORROIDECTOMY  4 years ago   LAPAROSCOPIC APPENDECTOMY N/A 01/09/2014   Procedure: APPENDECTOMY LAPAROSCOPIC;  Surgeon: Mariella Saa, MD;  Location: [facility];  Service: General;  Laterality: N/A;   left shoulder surgery     SHOULDER SURGERY      Allergies and medications reviewed and updated.   Current Outpatient Medications:     methocarbamol (ROBAXIN) 750 MG tablet, Take 1 tablet (750 mg total) by mouth 4 (four) times daily., Disp: 12 tablet, Rfl: 0   nebivolol (BYSTOLIC) 10 MG tablet, Take 1 tablet (10 mg total) by mouth daily., Disp: 90 tablet, Rfl: 1   pravastatin (PRAVACHOL) 40 MG tablet, Take 1 tablet (40 mg total) by mouth daily., Disp: 90 tablet, Rfl: 1   rizatriptan (MAXALT-MLT) 5 MG disintegrating tablet, Take 5 mg by mouth as needed for migraine. May repeat in 2 hours if needed, Disp: , Rfl:    sildenafil (REVATIO) 20 MG tablet, Take 20 mg by mouth 3 (three) times daily., Disp: , Rfl:    valACYclovir (VALTREX) 500 MG tablet, Take 500 mg by mouth 2 (two) times daily. PRN per pt, Disp: , Rfl:   Allergies  Allergen Reactions   Amoxicillin     Stomach cramps   Codeine Nausea And Vomiting    Stomach cramps    Objective:   BP 115/70   Pulse 73   Temp 98.2 F (36.8 C) (Oral)   Ht 5\' 10"  (1.778 m)   Wt 228 lb (103.4 kg)   SpO2 94%   BMI 32.71 kg/m      03/05/2023    8:10 AM 09/03/2022    9:29 AM 04/10/2015    2:16 PM  Vitals with BMI  Height 5\' 10"  5\' 10"    Weight 228 lbs 229 lbs   BMI 32.71 32.86   Systolic 115 122 161  Diastolic 70 80 91  Pulse 73 80 80  Physical Exam Vitals and nursing note reviewed.  Constitutional:      Appearance: Normal appearance. He is normal weight.  HENT:     Head: Normocephalic and atraumatic.     Right Ear: Tympanic membrane, ear canal and external ear normal.     Left Ear: Tympanic membrane, ear canal and external ear normal.     Nose: Congestion present.     Right Sinus: Frontal sinus tenderness present.     Left Sinus: Frontal sinus tenderness present.     Mouth/Throat:     Mouth: Mucous membranes are moist.  Eyes:     Conjunctiva/sclera: Conjunctivae normal.  Cardiovascular:     Rate and Rhythm: Normal rate and regular rhythm.     Pulses: Normal pulses.     Heart sounds: Normal heart sounds.  Pulmonary:     Effort: Pulmonary effort is normal.      Breath sounds: Normal breath sounds.  Musculoskeletal:     Cervical back: No tenderness.  Lymphadenopathy:     Cervical: No cervical adenopathy.  Skin:    General: Skin is warm and dry.     Capillary Refill: Capillary refill takes less than 2 seconds.  Neurological:     General: No focal deficit present.     Mental Status: He is alert and oriented to person, place, and time. Mental status is at baseline.  Psychiatric:        Mood and Affect: Mood normal.        Behavior: Behavior normal.        Thought Content: Thought content normal.        Judgment: Judgment normal.     Assessment & Plan:  Primary hypertension Assessment & Plan: Chronic. Well controlled. Goal <130/80. Continue Nebivolol 10mg  daily and home monitoring. Recommend heart healthy diet such as Mediterranean diet with whole grains, fruits, vegetable, fish, lean meats, nuts, and olive oil. Limit salt. Encouraged moderate walking, 3-5 times/week for 30-50 minutes each session. Aim for at least 150 minutes.week. Goal should be pace of 3 miles/hours, or walking 1.5 miles in 30 minutes. Avoid tobacco products. Avoid excess alcohol. Take medications as prescribed and bring medications and blood pressure log with cuff to each office visit. Seek medical care for chest pain, palpitations, shortness of breath with exertion, dizziness/lightheadedness, vision changes, recurrent headaches, or swelling of extremities. Follow up in 6 months.     Acute non-recurrent frontal sinusitis Assessment & Plan: Patient here today with frontal sinus tenderness and persistent purulent rhinorrhea that is not improving after 7 days. Will order Doxycycline 100mg  BID for 7 days. Return to office if symptoms persist or worsen.   Mixed hyperlipidemia Assessment & Plan: Continue Pravastatin 40mg  daily. I recommend consuming a heart healthy diet such as Mediterranean diet or DASH diet with whole grains, fruits, vegetable, fish, lean meats, nuts, and  olive oil. Limit sweets and processed foods. I also encourage moderate intensity exercise 150 minutes weekly. This is 3-5 times weekly for 30-50 minutes each session. Goal should be pace of 3 miles/hours, or walking 1.5 miles in 30 minutes. The 10-year ASCVD risk score (Arnett DK, et al., 2019) is: 4.7%       Follow up plan: Return in about 6 months (around 09/03/2023) for annual physical with labs 1 week prior.  Park Meo, FNP

## 2023-03-05 NOTE — Assessment & Plan Note (Signed)
Continue Pravastatin 40mg  daily. I recommend consuming a heart healthy diet such as Mediterranean diet or DASH diet with whole grains, fruits, vegetable, fish, lean meats, nuts, and olive oil. Limit sweets and processed foods. I also encourage moderate intensity exercise 150 minutes weekly. This is 3-5 times weekly for 30-50 minutes each session. Goal should be pace of 3 miles/hours, or walking 1.5 miles in 30 minutes. The 10-year ASCVD risk score (Arnett DK, et al., 2019) is: 4.7%

## 2023-03-09 ENCOUNTER — Encounter: Payer: Self-pay | Admitting: Family Medicine

## 2023-03-10 ENCOUNTER — Other Ambulatory Visit: Payer: Self-pay | Admitting: Family Medicine

## 2023-03-10 MED ORDER — BENZONATATE 100 MG PO CAPS: 100.0000 mg | ORAL_CAPSULE | Freq: Three times a day (TID) | ORAL | 0 refills | Status: AC | PRN

## 2023-03-30 DIAGNOSIS — L814 Other melanin hyperpigmentation: Secondary | ICD-10-CM | POA: Diagnosis not present

## 2023-03-30 DIAGNOSIS — D225 Melanocytic nevi of trunk: Secondary | ICD-10-CM | POA: Diagnosis not present

## 2023-03-30 DIAGNOSIS — L821 Other seborrheic keratosis: Secondary | ICD-10-CM | POA: Diagnosis not present

## 2023-03-30 DIAGNOSIS — L920 Granuloma annulare: Secondary | ICD-10-CM | POA: Diagnosis not present

## 2023-09-03 ENCOUNTER — Other Ambulatory Visit

## 2023-09-03 DIAGNOSIS — Z125 Encounter for screening for malignant neoplasm of prostate: Secondary | ICD-10-CM

## 2023-09-03 DIAGNOSIS — E66811 Obesity, class 1: Secondary | ICD-10-CM | POA: Diagnosis not present

## 2023-09-03 DIAGNOSIS — I10 Essential (primary) hypertension: Secondary | ICD-10-CM

## 2023-09-03 DIAGNOSIS — E782 Mixed hyperlipidemia: Secondary | ICD-10-CM

## 2023-09-03 DIAGNOSIS — Z6832 Body mass index (BMI) 32.0-32.9, adult: Secondary | ICD-10-CM | POA: Diagnosis not present

## 2023-09-03 DIAGNOSIS — B379 Candidiasis, unspecified: Secondary | ICD-10-CM | POA: Diagnosis not present

## 2023-09-04 ENCOUNTER — Other Ambulatory Visit: Payer: BC Managed Care – PPO

## 2023-09-04 LAB — CBC WITH DIFFERENTIAL/PLATELET
Absolute Lymphocytes: 3134 {cells}/uL (ref 850–3900)
Absolute Monocytes: 447 {cells}/uL (ref 200–950)
Basophils Absolute: 31 {cells}/uL (ref 0–200)
Basophils Relative: 0.4 %
Eosinophils Absolute: 185 {cells}/uL (ref 15–500)
Eosinophils Relative: 2.4 %
HCT: 47.1 % (ref 38.5–50.0)
Hemoglobin: 15.4 g/dL (ref 13.2–17.1)
MCH: 30.9 pg (ref 27.0–33.0)
MCHC: 32.7 g/dL (ref 32.0–36.0)
MCV: 94.6 fL (ref 80.0–100.0)
MPV: 9.4 fL (ref 7.5–12.5)
Monocytes Relative: 5.8 %
Neutro Abs: 3904 {cells}/uL (ref 1500–7800)
Neutrophils Relative %: 50.7 %
Platelets: 246 10*3/uL (ref 140–400)
RBC: 4.98 10*6/uL (ref 4.20–5.80)
RDW: 12.3 % (ref 11.0–15.0)
Total Lymphocyte: 40.7 %
WBC: 7.7 10*3/uL (ref 3.8–10.8)

## 2023-09-04 LAB — PSA: PSA: 0.92 ng/mL (ref <=4.00)

## 2023-09-04 LAB — COMPLETE METABOLIC PANEL WITHOUT GFR
AG Ratio: 2 (calc) (ref 1.0–2.5)
ALT: 38 U/L (ref 9–46)
AST: 26 U/L (ref 10–35)
Albumin: 4.4 g/dL (ref 3.6–5.1)
Alkaline phosphatase (APISO): 91 U/L (ref 35–144)
BUN: 14 mg/dL (ref 7–25)
CO2: 27 mmol/L (ref 20–32)
Calcium: 9.7 mg/dL (ref 8.6–10.3)
Chloride: 104 mmol/L (ref 98–110)
Creat: 1 mg/dL (ref 0.70–1.30)
Globulin: 2.2 g/dL (ref 1.9–3.7)
Glucose, Bld: 93 mg/dL (ref 65–99)
Potassium: 4.4 mmol/L (ref 3.5–5.3)
Sodium: 141 mmol/L (ref 135–146)
Total Bilirubin: 0.5 mg/dL (ref 0.2–1.2)
Total Protein: 6.6 g/dL (ref 6.1–8.1)

## 2023-09-04 LAB — LIPID PANEL
Cholesterol: 172 mg/dL (ref <200)
HDL: 45 mg/dL (ref >=40)
LDL Cholesterol (Calc): 103 mg/dL — ABNORMAL HIGH
Non-HDL Cholesterol (Calc): 127 mg/dL (ref <130)
Total CHOL/HDL Ratio: 3.8 (calc) (ref <5.0)
Triglycerides: 147 mg/dL (ref <150)

## 2023-09-07 ENCOUNTER — Encounter: Payer: BC Managed Care – PPO | Admitting: Family Medicine

## 2023-09-10 ENCOUNTER — Ambulatory Visit: Admitting: Family Medicine

## 2023-09-10 ENCOUNTER — Encounter: Payer: Self-pay | Admitting: Family Medicine

## 2023-09-10 VITALS — BP 136/78 | HR 69 | Resp 16 | Ht 70.0 in | Wt 230.8 lb

## 2023-09-10 DIAGNOSIS — Z0001 Encounter for general adult medical examination with abnormal findings: Secondary | ICD-10-CM

## 2023-09-10 DIAGNOSIS — I10 Essential (primary) hypertension: Secondary | ICD-10-CM

## 2023-09-10 DIAGNOSIS — E66811 Obesity, class 1: Secondary | ICD-10-CM

## 2023-09-10 DIAGNOSIS — Z0000 Encounter for general adult medical examination without abnormal findings: Secondary | ICD-10-CM | POA: Insufficient documentation

## 2023-09-10 DIAGNOSIS — E782 Mixed hyperlipidemia: Secondary | ICD-10-CM | POA: Diagnosis not present

## 2023-09-10 DIAGNOSIS — M51379 Other intervertebral disc degeneration, lumbosacral region without mention of lumbar back pain or lower extremity pain: Secondary | ICD-10-CM

## 2023-09-10 DIAGNOSIS — Z23 Encounter for immunization: Secondary | ICD-10-CM | POA: Diagnosis not present

## 2023-09-10 DIAGNOSIS — Z6832 Body mass index (BMI) 32.0-32.9, adult: Secondary | ICD-10-CM

## 2023-09-10 NOTE — Assessment & Plan Note (Signed)
Chronic. Has had imaging and consult with surgery and no plans for intervention at this time. Well controlled with occasional Robaxin, approximately twice yearly. Provided refill for current exacerbation. No saddle numbness, incontinence, or bony tenderness.

## 2023-09-10 NOTE — Assessment & Plan Note (Signed)
 LDL 103, encouraged increase in physical activity, would like to defer increase in medication at this time, will recheck in 6 months. Continue Pravastatin  40mg  daily. I recommend consuming a heart healthy diet such as Mediterranean diet or DASH diet with whole grains, fruits, vegetable, fish, lean meats, nuts, and olive oil. Limit sweets and processed foods. I also encourage moderate intensity exercise 150 minutes weekly. This is 3-5 times weekly for 30-50 minutes each session. Goal should be pace of 3 miles/hours, or walking 1.5 miles in 30 minutes. The 10-year ASCVD risk score (Arnett DK, et al., 2019) is: 8.1%

## 2023-09-10 NOTE — Assessment & Plan Note (Signed)
 Chronic. Well controlled. Continue Nebivolol  10mg  daily and home monitoring. Recommend heart healthy diet such as Mediterranean diet with whole grains, fruits, vegetable, fish, lean meats, nuts, and olive oil. Limit salt. Encouraged moderate walking, 3-5 times/week for 30-50 minutes each session. Aim for at least 150 minutes.week. Goal should be pace of 3 miles/hours, or walking 1.5 miles in 30 minutes. Avoid tobacco products. Avoid excess alcohol. Take medications as prescribed and bring medications and blood pressure log with cuff to each office visit. Seek medical care for chest pain, palpitations, shortness of breath with exertion, dizziness/lightheadedness, vision changes, recurrent headaches, or swelling of extremities. Follow up in 6 months.

## 2023-09-10 NOTE — Assessment & Plan Note (Signed)
 Today your medical history was reviewed and routine physical exam with labs was performed. Recommend 150 minutes of moderate intensity exercise weekly and consuming a well-balanced diet. Advised to stop smoking if a smoker, avoid smoking if a non-smoker, limit alcohol consumption to 1 drink per day for women and 2 drinks per day for men, and avoid illicit drug use. Counseled in mental health awareness and when to seek medical care. Vaccine maintenance discussed. Appropriate health maintenance items reviewed. Second dose shingles admin today. Return to office in 1 year for annual physical exam.

## 2023-09-10 NOTE — Progress Notes (Signed)
 Complete physical exam  Patient: Larry Velez   DOB: 1965/12/17   58 y.o. Male  MRN: [id number]  Subjective:    Chief Complaint  Patient presents with   Annual Exam    Larry Velez is a 58 y.o. male who presents today for a complete physical exam. He reports consuming a general diet. The patient does not participate in regular exercise at present. He generally feels well. He reports sleeping well. He does not have additional problems to discuss today.   HYPERTENSION / HYPERLIPIDEMIA Satisfied with current treatment? yes Duration of hypertension: chronic BP monitoring frequency: not checking BP range:  BP medication side effects: no Past BP meds: nebivolol  Duration of hyperlipidemia: chronic Cholesterol medication side effects: no Cholesterol supplements: none Past cholesterol medications: pravastatin  Medication compliance: excellent compliance Aspirin: no Recent stressors: no Recurrent headaches: no Visual changes: no Palpitations: no Dyspnea: no Chest pain: no Lower extremity edema: no Dizzy/lightheaded: no   Most recent fall risk assessment:    09/10/2023   11:31 AM  Fall Risk   Falls in the past year? 0  Number falls in past yr: 0  Injury with Fall? 0  Risk for fall due to : No Fall Risks  Follow up Falls evaluation completed;Falls prevention discussed     Most recent depression screenings:    09/10/2023   11:32 AM 03/05/2023    8:51 AM  PHQ 2/9 Scores  PHQ - 2 Score 0 0  PHQ- 9 Score 0 0    Vision:Within last year and Dental: No current dental problems and Receives regular dental care  Patient Active Problem List   Diagnosis Date Noted   Physical exam, annual 09/10/2023   Acute non-recurrent frontal sinusitis 03/05/2023   Family history of colonic polyps 09/15/2022   Encounter for screening for malignant neoplasm of colon 09/15/2022   Primary hypertension 09/03/2022   Encounter to establish care with new doctor 09/03/2022   Class 1  obesity with serious comorbidity and body mass index (BMI) of 32.0 to 32.9 in adult 09/03/2022   Degenerative disc disease at L5-S1 level 09/03/2022   Acute appendicitis 01/09/2014   Family history of factor V deficiency    Impingement syndrome of left shoulder 07/18/2011   Partial tear subscapularis tendon 07/18/2011   Hyperlipidemia    DJD (degenerative joint disease)    PAC (premature atrial contraction)    Past Medical History:  Diagnosis Date   DJD (degenerative joint disease)    Family history of factor V deficiency    GERD (gastroesophageal reflux disease)    resolved with diet change, no meds for 3 months   Hyperlipidemia    Impingement syndrome of left shoulder 07/18/2011   PAC (premature atrial contraction)    Partial tear subscapularis tendon 07/18/2011   Past Surgical History:  Procedure Laterality Date   APPENDECTOMY     COSMETIC SURGERY  05/19/1970   On both ears   EYE SURGERY     lasik   HEMORROIDECTOMY  4 years ago   LAPAROSCOPIC APPENDECTOMY N/A 01/09/2014   Procedure: APPENDECTOMY LAPAROSCOPIC;  Surgeon: Quitman Bucy, MD;  Location: [facility];  Service: General;  Laterality: N/A;   left shoulder surgery     SHOULDER SURGERY     Social History   Tobacco Use   Smoking status: Never   Smokeless tobacco: Never  Substance Use Topics   Alcohol use: Yes    Comment: 1 or 2 bottles beer per week   Drug use:  No   Family History  Problem Relation Age of Onset   Clotting disorder Sister    Heart disease Sister        bicuspid AV   Allergies  Allergen Reactions   Amoxicillin     Stomach cramps   Codeine Nausea And Vomiting    Stomach cramps      Patient Care Team: Jenelle Mis, FNP as PCP - General (Family Medicine)   Outpatient Medications Prior to Visit  Medication Sig   methocarbamol  (ROBAXIN ) 750 MG tablet Take 1 tablet (750 mg total) by mouth 4 (four) times daily.   nebivolol  (BYSTOLIC ) 10 MG tablet Take 1 tablet (10 mg total) by mouth  daily.   pravastatin  (PRAVACHOL ) 40 MG tablet Take 1 tablet (40 mg total) by mouth daily.   rizatriptan (MAXALT-MLT) 5 MG disintegrating tablet Take 5 mg by mouth as needed for migraine. May repeat in 2 hours if needed   sildenafil (REVATIO) 20 MG tablet Take 20 mg by mouth 3 (three) times daily.   valACYclovir (VALTREX) 500 MG tablet Take 500 mg by mouth 2 (two) times daily. PRN per pt   No facility-administered medications prior to visit.    Review of Systems  Constitutional: Negative.   HENT: Negative.    Eyes: Negative.   Respiratory: Negative.    Cardiovascular: Negative.   Gastrointestinal: Negative.   Genitourinary: Negative.   Musculoskeletal: Negative.   Skin: Negative.   Neurological: Negative.   Endo/Heme/Allergies: Negative.   Psychiatric/Behavioral: Negative.    All other systems reviewed and are negative.         Objective:     BP 136/78   Pulse 69   Resp 16   Ht 5\' 10"  (1.778 m)   Wt 230 lb 12.8 oz (104.7 kg)   SpO2 96%   BMI 33.12 kg/m  BP Readings from Last 3 Encounters:  09/10/23 136/78  03/05/23 115/70  09/03/22 122/80   Wt Readings from Last 3 Encounters:  09/10/23 230 lb 12.8 oz (104.7 kg)  03/05/23 228 lb (103.4 kg)  09/03/22 229 lb (103.9 kg)      Physical Exam Vitals and nursing note reviewed.  Constitutional:      Appearance: Normal appearance. He is normal weight.  HENT:     Head: Normocephalic and atraumatic.     Right Ear: Tympanic membrane, ear canal and external ear normal.     Left Ear: Tympanic membrane, ear canal and external ear normal.     Nose: Nose normal.     Mouth/Throat:     Mouth: Mucous membranes are moist.     Pharynx: Oropharynx is clear.  Eyes:     Extraocular Movements: Extraocular movements intact.     Right eye: Normal extraocular motion and no nystagmus.     Left eye: Normal extraocular motion and no nystagmus.     Conjunctiva/sclera: Conjunctivae normal.     Pupils: Pupils are equal, round, and  reactive to light.  Cardiovascular:     Rate and Rhythm: Normal rate and regular rhythm.     Pulses: Normal pulses.     Heart sounds: Normal heart sounds.  Pulmonary:     Effort: Pulmonary effort is normal.     Breath sounds: Normal breath sounds.  Abdominal:     General: Bowel sounds are normal.     Palpations: Abdomen is soft.  Genitourinary:    Comments: Deferred using shared decision making Musculoskeletal:        General: Normal  range of motion.     Cervical back: Normal range of motion and neck supple.  Skin:    General: Skin is warm and dry.     Capillary Refill: Capillary refill takes less than 2 seconds.  Neurological:     General: No focal deficit present.     Mental Status: He is alert. Mental status is at baseline.  Psychiatric:        Mood and Affect: Mood normal.        Speech: Speech normal.        Behavior: Behavior normal.        Thought Content: Thought content normal.        Cognition and Memory: Cognition and memory normal.        Judgment: Judgment normal.      No results found for any visits on 09/10/23. Last CBC Lab Results  Component Value Date   WBC 7.7 09/03/2023   HGB 15.4 09/03/2023   HCT 47.1 09/03/2023   MCV 94.6 09/03/2023   MCH 30.9 09/03/2023   RDW 12.3 09/03/2023   PLT 246 09/03/2023   Last metabolic panel Lab Results  Component Value Date   GLUCOSE 93 09/03/2023   NA 141 09/03/2023   K 4.4 09/03/2023   CL 104 09/03/2023   CO2 27 09/03/2023   BUN 14 09/03/2023   CREATININE 1.00 09/03/2023   EGFR 85 09/03/2022   CALCIUM 9.7 09/03/2023   PROT 6.6 09/03/2023   ALBUMIN 4.4 01/09/2014   BILITOT 0.5 09/03/2023   ALKPHOS 102 01/09/2014   AST 26 09/03/2023   ALT 38 09/03/2023   ANIONGAP 13 01/09/2014   Last lipids Lab Results  Component Value Date   CHOL 172 09/03/2023   HDL 45 09/03/2023   LDLCALC 103 (H) 09/03/2023   TRIG 147 09/03/2023   CHOLHDL 3.8 09/03/2023     Assessment & Plan:    Routine Health Maintenance  and Physical Exam  Immunization History  Administered Date(s) Administered   Influenza Inj Mdck Quad Pf 06/01/2016   Zoster Recombinant(Shingrix ) 09/10/2023    Health Maintenance  Topic Date Due   COVID-19 Vaccine (1 - 2024-25 season) 09/26/2023 (Originally 01/18/2023)   INFLUENZA VACCINE  12/18/2023   Fecal DNA (Cologuard)  09/15/2025   Hepatitis C Screening  Completed   HIV Screening  Completed   Zoster Vaccines- Shingrix   Completed   HPV VACCINES  Aged Out   Meningococcal B Vaccine  Aged Out   DTaP/Tdap/Td  Discontinued    Discussed health benefits of physical activity, and encouraged him to engage in regular exercise appropriate for his age and condition.  Problem List Items Addressed This Visit     Hyperlipidemia   LDL 103, encouraged increase in physical activity, would like to defer increase in medication at this time, will recheck in 6 months. Continue Pravastatin  40mg  daily. I recommend consuming a heart healthy diet such as Mediterranean diet or DASH diet with whole grains, fruits, vegetable, fish, lean meats, nuts, and olive oil. Limit sweets and processed foods. I also encourage moderate intensity exercise 150 minutes weekly. This is 3-5 times weekly for 30-50 minutes each session. Goal should be pace of 3 miles/hours, or walking 1.5 miles in 30 minutes. The 10-year ASCVD risk score (Arnett DK, et al., 2019) is: 8.1%       Primary hypertension   Chronic. Well controlled. Continue Nebivolol  10mg  daily and home monitoring. Recommend heart healthy diet such as Mediterranean diet with whole grains, fruits, vegetable,  fish, lean meats, nuts, and olive oil. Limit salt. Encouraged moderate walking, 3-5 times/week for 30-50 minutes each session. Aim for at least 150 minutes.week. Goal should be pace of 3 miles/hours, or walking 1.5 miles in 30 minutes. Avoid tobacco products. Avoid excess alcohol. Take medications as prescribed and bring medications and blood pressure log with cuff  to each office visit. Seek medical care for chest pain, palpitations, shortness of breath with exertion, dizziness/lightheadedness, vision changes, recurrent headaches, or swelling of extremities. Follow up in 6 months.        Class 1 obesity with serious comorbidity and body mass index (BMI) of 32.0 to 32.9 in adult   BMI 33.12. Counseled on importance of weight management for overall health. Encouraged low calorie, heart healthy diet and moderate intensity exercise 150 minutes weekly. This is 3-5 times weekly for 30-50 minutes each session. Goal should be pace of 3 miles/hours, or walking 1.5 miles in 30 minutes and include strength training.       Degenerative disc disease at L5-S1 level   Chronic. Has had imaging and consult with surgery and no plans for intervention at this time. Well controlled with occasional Robaxin , approximately twice yearly. Provided refill for current exacerbation. No saddle numbness, incontinence, or bony tenderness.      Physical exam, annual - Primary   Today your medical history was reviewed and routine physical exam with labs was performed. Recommend 150 minutes of moderate intensity exercise weekly and consuming a well-balanced diet. Advised to stop smoking if a smoker, avoid smoking if a non-smoker, limit alcohol consumption to 1 drink per day for women and 2 drinks per day for men, and avoid illicit drug use. Counseled in mental health awareness and when to seek medical care. Vaccine maintenance discussed. Appropriate health maintenance items reviewed. Second dose shingles admin today. Return to office in 1 year for annual physical exam.       Relevant Orders   CBC with Differential/Platelet   Comprehensive metabolic panel with GFR   Lipid panel   Hemoglobin A1c   PSA   Other Visit Diagnoses       Encounter for immunization       Relevant Orders   Varicella-zoster vaccine IM (Completed)      Return in about 6 months (around 03/11/2024) for chronic  follow-up with labs 1 week prior.     Jenelle Mis, FNP

## 2023-09-10 NOTE — Assessment & Plan Note (Signed)
 BMI 33.12. Counseled on importance of weight management for overall health. Encouraged low calorie, heart healthy diet and moderate intensity exercise 150 minutes weekly. This is 3-5 times weekly for 30-50 minutes each session. Goal should be pace of 3 miles/hours, or walking 1.5 miles in 30 minutes and include strength training.

## 2023-09-21 DIAGNOSIS — N432 Other hydrocele: Secondary | ICD-10-CM | POA: Diagnosis not present

## 2023-09-30 ENCOUNTER — Other Ambulatory Visit: Payer: Self-pay | Admitting: Urology

## 2023-10-01 ENCOUNTER — Encounter (HOSPITAL_COMMUNITY)
Admission: RE | Admit: 2023-10-01 | Discharge: 2023-10-01 | Disposition: A | Discharge status: Home / Self Care | Source: Ambulatory Visit | Attending: Urology | Admitting: Urology

## 2023-10-01 ENCOUNTER — Other Ambulatory Visit: Payer: Self-pay

## 2023-10-01 ENCOUNTER — Encounter (HOSPITAL_COMMUNITY): Payer: Self-pay

## 2023-10-01 VITALS — Ht 70.0 in | Wt 230.0 lb

## 2023-10-01 DIAGNOSIS — I10 Essential (primary) hypertension: Secondary | ICD-10-CM

## 2023-10-01 DIAGNOSIS — Z01818 Encounter for other preprocedural examination: Secondary | ICD-10-CM | POA: Insufficient documentation

## 2023-10-01 HISTORY — DX: Sleep apnea, unspecified: G47.30

## 2023-10-01 HISTORY — DX: Essential (primary) hypertension: I10

## 2023-10-01 NOTE — Patient Instructions (Addendum)
 SURGICAL WAITING ROOM VISITATION  Patients having surgery or a procedure may have no more than 2 support people in the waiting area - these visitors may rotate.    Children under the age of 26 must have an adult with them who is not the patient.    Visitors with respiratory illnesses are discouraged from visiting and should remain at home.  If the patient needs to stay at the hospital during part of their recovery, the visitor guidelines for inpatient rooms apply. Pre-op nurse will coordinate an appropriate time for 1 support person to accompany patient in pre-op.  This support person may not rotate.    Please refer to the [facility] website for the visitor guidelines for Inpatients (after your surgery is over and you are in a regular room).       Your procedure is scheduled on: 10-13-23   Report to [facility] Main Entrance    Report to admitting at     0845  AM   Call this number if you have problems the morning of surgery [phone]   Do not eat food or DRINK LIQUIDS  :After Midnight.              If you have questions, please contact your surgeon's office.   FOLLOW  ANY ADDITIONAL PRE OP INSTRUCTIONS YOU RECEIVED FROM YOUR SURGEON'S OFFICE!!!     Oral Hygiene is also important to reduce your risk of infection.                                    Remember - BRUSH YOUR TEETH THE MORNING OF SURGERY WITH YOUR REGULAR TOOTHPASTE  DENTURES WILL BE REMOVED PRIOR TO SURGERY PLEASE DO NOT APPLY "Poly grip" OR ADHESIVES!!!   Do NOT smoke after Midnight   Stop all vitamins and herbal supplements 7 days before surgery.   Take these medicines the morning of surgery with A SIP OF WATER:  nebivolol                                You may not have any metal on your body including hair pins, jewelry, and body piercing             Do not wear lotions, powders, /cologne, or deodorant              Men may shave face and neck.   Do not bring valuables to the hospital. [facility] IS NOT             RESPONSIBLE   FOR VALUABLES.   Contacts, glasses, dentures or bridgework may not be worn into surgery.    DO NOT BRING YOUR HOME MEDICATIONS TO THE HOSPITAL. PHARMACY WILL DISPENSE MEDICATIONS LISTED ON YOUR MEDICATION LIST TO YOU DURING YOUR ADMISSION IN THE HOSPITAL!    Patients discharged on the day of surgery will not be allowed to drive home.  Someone NEEDS to stay with you for the first 24 hours after anesthesia.   Special Instructions: Bring a copy of your healthcare power of attorney and living will documents the day of surgery if you haven't scanned them before.              Please read over the following fact sheets you were given: IF YOU HAVE QUESTIONS ABOUT YOUR PRE-OP INSTRUCTIONS PLEASE CALL [phone]  If you test positive for Covid or have been in contact with anyone that has tested positive in the last 10 days please notify you surgeon.    [facility] - Preparing for Surgery Before surgery, you can play an important role.  Because skin is not sterile, your skin needs to be as free of germs as possible.  You can reduce the number of germs on your skin by washing with CHG (chlorahexidine gluconate) soap before surgery.  CHG is an antiseptic cleaner which kills germs and bonds with the skin to continue killing germs even after washing. Please DO NOT use if you have an allergy to CHG or antibacterial soaps.  If your skin becomes reddened/irritated stop using the CHG and inform your nurse when you arrive at Short Stay. Do not shave (including legs and underarms) for at least 48 hours prior to the first CHG shower.  You may shave your face/neck. Please follow these instructions carefully:  1.  Shower with CHG Soap the night before surgery and the  morning of Surgery.  2.  If you choose to wash your hair, wash your hair first as usual with your  normal  shampoo.  3.  After you shampoo, rinse your hair and body thoroughly to remove the  shampoo.                            4.  Use CHG as you would any other liquid soap.  You can apply chg directly  to the skin and wash                       Gently with a scrungie or clean washcloth.  5.  Apply the CHG Soap to your body ONLY FROM THE NECK DOWN.   Do not use on face/ open                           Wound or open sores. Avoid contact with eyes, ears mouth and genitals (private parts).                       Wash face,  Genitals (private parts) with your normal soap.             6.  Wash thoroughly, paying special attention to the area where your surgery  will be performed.  7.  Thoroughly rinse your body with warm water from the neck down.  8.  DO NOT shower/wash with your normal soap after using and rinsing off  the CHG Soap.                9.  Pat yourself dry with a clean towel.            10.  Wear clean pajamas.            11.  Place clean sheets on your bed the night of your first shower and do not  sleep with pets. Day of Surgery : Do not apply any lotions/deodorants the morning of surgery.  Please wear clean clothes to the hospital/surgery center.  FAILURE TO FOLLOW THESE INSTRUCTIONS MAY RESULT IN THE CANCELLATION OF YOUR SURGERY PATIENT SIGNATURE_________________________________  NURSE SIGNATURE__________________________________  ________________________________________________________________________

## 2023-10-01 NOTE — Progress Notes (Addendum)
 PCP - Yolanda Hence , FNP LOV 09-10-23 epic Cardiologist - Peter Jordan 06-23-2013 saw for PAC's  only sees as needed now.   PPM/ICD -  Device Orders -  Rep Notified -   Chest x-ray -  EKG -  Stress Test -  ECHO -  Cardiac Cath -   Sleep Study - mild  CPAP - yes  Fasting Blood Sugar -  Checks Blood Sugar __N/A___ times a day  Blood Thinner Instructions:n/a Aspirin Instructions:n/a  ERAS Protcol - PRE-SURGERY NPO MN  Phone call appt. Labs/ EKG 10-02-23 @ 0800  COVID vaccine -yes  Activity--Able to climb a flight of stairs with no CP or SOB  Anesthesia review: HTN, DJD,PAC,DDD  Patient denies shortness of breath, fever, cough and chest pain at PAT appointment   All instructions explained to the patient, with a verbal understanding of the material. Patient agrees to go over the instructions while at home for a better understanding. Patient also instructed to self quarantine after being tested for COVID-19. The opportunity to ask questions was provided.

## 2023-10-02 ENCOUNTER — Encounter (HOSPITAL_COMMUNITY)
Admission: RE | Admit: 2023-10-02 | Discharge: 2023-10-02 | Disposition: A | Discharge status: Home / Self Care | Source: Ambulatory Visit | Attending: Urology | Admitting: Urology

## 2023-10-02 DIAGNOSIS — I10 Essential (primary) hypertension: Secondary | ICD-10-CM | POA: Insufficient documentation

## 2023-10-02 DIAGNOSIS — Z01818 Encounter for other preprocedural examination: Secondary | ICD-10-CM | POA: Diagnosis not present

## 2023-10-02 LAB — CBC
HCT: 46.1 % (ref 39.0–52.0)
Hemoglobin: 15.2 g/dL (ref 13.0–17.0)
MCH: 31.7 pg (ref 26.0–34.0)
MCHC: 33 g/dL (ref 30.0–36.0)
MCV: 96 fL (ref 80.0–100.0)
Platelets: 224 10*3/uL (ref 150–400)
RBC: 4.8 MIL/uL (ref 4.22–5.81)
RDW: 11.9 % (ref 11.5–15.5)
WBC: 6.7 10*3/uL (ref 4.0–10.5)
nRBC: 0 % (ref 0.0–0.2)

## 2023-10-02 LAB — BASIC METABOLIC PANEL WITH GFR
Anion gap: 5 (ref 5–15)
BUN: 21 mg/dL — ABNORMAL HIGH (ref 6–20)
CO2: 25 mmol/L (ref 22–32)
Calcium: 9.3 mg/dL (ref 8.9–10.3)
Chloride: 105 mmol/L (ref 98–111)
Creatinine, Ser: 0.94 mg/dL (ref 0.61–1.24)
GFR, Estimated: >60 mL/min (ref >60)
Glucose, Bld: 103 mg/dL — ABNORMAL HIGH (ref 70–99)
Potassium: 4.7 mmol/L (ref 3.5–5.1)
Sodium: 135 mmol/L (ref 135–145)

## 2023-10-13 ENCOUNTER — Ambulatory Visit (HOSPITAL_COMMUNITY)
Admission: RE | Admit: 2023-10-13 | Discharge: 2023-10-13 | Disposition: A | Discharge status: Home / Self Care | Source: Ambulatory Visit | Attending: Urology | Admitting: Urology

## 2023-10-13 ENCOUNTER — Ambulatory Visit (HOSPITAL_COMMUNITY): Payer: Self-pay | Admitting: Physician Assistant

## 2023-10-13 ENCOUNTER — Ambulatory Visit (HOSPITAL_COMMUNITY): Admitting: Certified Registered Nurse Anesthetist

## 2023-10-13 ENCOUNTER — Encounter (HOSPITAL_COMMUNITY): Payer: Self-pay | Admitting: Urology

## 2023-10-13 ENCOUNTER — Encounter (HOSPITAL_COMMUNITY)
Admission: RE | Disposition: A | Discharge status: Home / Self Care | Payer: Self-pay | Source: Ambulatory Visit | Attending: Urology

## 2023-10-13 DIAGNOSIS — I10 Essential (primary) hypertension: Secondary | ICD-10-CM | POA: Diagnosis not present

## 2023-10-13 DIAGNOSIS — N4340 Spermatocele of epididymis, unspecified: Secondary | ICD-10-CM | POA: Diagnosis not present

## 2023-10-13 DIAGNOSIS — G4730 Sleep apnea, unspecified: Secondary | ICD-10-CM | POA: Insufficient documentation

## 2023-10-13 DIAGNOSIS — N4341 Spermatocele of epididymis, single: Secondary | ICD-10-CM | POA: Insufficient documentation

## 2023-10-13 HISTORY — PX: HYDROCELE EXCISION: SHX482

## 2023-10-13 SURGERY — HYDROCELECTOMY
Anesthesia: General | Laterality: Right

## 2023-10-13 MED ORDER — EPHEDRINE 5 MG/ML INJ
INTRAVENOUS | Status: AC
  Filled 2023-10-13: qty 5

## 2023-10-13 MED ORDER — PROPOFOL 10 MG/ML IV BOLUS
INTRAVENOUS | Status: DC | PRN
  Administered 2023-10-13: 200 mg via INTRAVENOUS

## 2023-10-13 MED ORDER — EPHEDRINE SULFATE-NACL 50-0.9 MG/10ML-% IV SOSY
PREFILLED_SYRINGE | INTRAVENOUS | Status: DC | PRN
  Administered 2023-10-13 (×3): 5 mg via INTRAVENOUS

## 2023-10-13 MED ORDER — LIDOCAINE HCL (PF) 2 % IJ SOLN
INTRAMUSCULAR | Status: DC | PRN
  Administered 2023-10-13: 60 mg via INTRADERMAL

## 2023-10-13 MED ORDER — CLINDAMYCIN PHOSPHATE 900 MG/50ML IV SOLN
900.0000 mg | INTRAVENOUS | Status: AC
  Administered 2023-10-13: 900 mg via INTRAVENOUS

## 2023-10-13 MED ORDER — AMISULPRIDE (ANTIEMETIC) 5 MG/2ML IV SOLN: 10.0000 mg | Freq: Once | INTRAVENOUS | Status: DC | PRN

## 2023-10-13 MED ORDER — FENTANYL CITRATE PF 50 MCG/ML IJ SOSY: 25.0000 ug | PREFILLED_SYRINGE | INTRAMUSCULAR | Status: DC | PRN

## 2023-10-13 MED ORDER — OXYCODONE-ACETAMINOPHEN 5-325 MG PO TABS: 1.0000 | ORAL_TABLET | ORAL | 0 refills | Status: DC | PRN

## 2023-10-13 MED ORDER — CHLORHEXIDINE GLUCONATE 0.12 % MT SOLN
15.0000 mL | Freq: Once | OROMUCOSAL | Status: AC
  Administered 2023-10-13: 15 mL via OROMUCOSAL

## 2023-10-13 MED ORDER — BUPIVACAINE HCL (PF) 0.5 % IJ SOLN
INTRAMUSCULAR | Status: AC
  Filled 2023-10-13: qty 30

## 2023-10-13 MED ORDER — ONDANSETRON HCL 4 MG/2ML IJ SOLN
INTRAMUSCULAR | Status: AC
  Filled 2023-10-13: qty 2

## 2023-10-13 MED ORDER — PHENYLEPHRINE 80 MCG/ML (10ML) SYRINGE FOR IV PUSH (FOR BLOOD PRESSURE SUPPORT)
PREFILLED_SYRINGE | INTRAVENOUS | Status: AC
  Filled 2023-10-13: qty 10

## 2023-10-13 MED ORDER — DEXAMETHASONE SODIUM PHOSPHATE 10 MG/ML IJ SOLN
INTRAMUSCULAR | Status: AC
  Filled 2023-10-13: qty 1

## 2023-10-13 MED ORDER — MIDAZOLAM HCL 2 MG/2ML IJ SOLN
INTRAMUSCULAR | Status: AC
  Filled 2023-10-13: qty 2

## 2023-10-13 MED ORDER — ONDANSETRON HCL 4 MG/2ML IJ SOLN
INTRAMUSCULAR | Status: DC | PRN
Start: 2023-10-13 — End: 2023-10-13
  Administered 2023-10-13: 4 mg via INTRAVENOUS

## 2023-10-13 MED ORDER — SODIUM CHLORIDE 0.9 % IV SOLN: 12.5000 mg | INTRAVENOUS | Status: DC | PRN

## 2023-10-13 MED ORDER — OXYCODONE HCL 5 MG PO TABS: 5.0000 mg | ORAL_TABLET | Freq: Once | ORAL | Status: DC | PRN

## 2023-10-13 MED ORDER — OXYCODONE HCL 5 MG/5ML PO SOLN: 5.0000 mg | Freq: Once | ORAL | Status: DC | PRN

## 2023-10-13 MED ORDER — MIDAZOLAM HCL 2 MG/2ML IJ SOLN
INTRAMUSCULAR | Status: DC | PRN
  Administered 2023-10-13 (×2): 1 mg via INTRAVENOUS

## 2023-10-13 MED ORDER — DEXAMETHASONE SODIUM PHOSPHATE 10 MG/ML IJ SOLN
INTRAMUSCULAR | Status: DC | PRN
Start: 2023-10-13 — End: 2023-10-13
  Administered 2023-10-13: 10 mg via INTRAVENOUS

## 2023-10-13 MED ORDER — FENTANYL CITRATE (PF) 100 MCG/2ML IJ SOLN
INTRAMUSCULAR | Status: DC | PRN
  Administered 2023-10-13 (×4): 25 ug via INTRAVENOUS

## 2023-10-13 MED ORDER — FENTANYL CITRATE (PF) 100 MCG/2ML IJ SOLN
INTRAMUSCULAR | Status: AC
  Filled 2023-10-13: qty 2

## 2023-10-13 MED ORDER — BUPIVACAINE-EPINEPHRINE (PF) 0.5% -1:200000 IJ SOLN
INTRAMUSCULAR | Status: DC | PRN
  Administered 2023-10-13: 10 mL

## 2023-10-13 MED ORDER — LACTATED RINGERS IV SOLN: INTRAVENOUS | Status: DC | PRN

## 2023-10-13 MED ORDER — LIDOCAINE HCL (PF) 2 % IJ SOLN
INTRAMUSCULAR | Status: AC
  Filled 2023-10-13: qty 5

## 2023-10-13 MED ORDER — 0.9 % SODIUM CHLORIDE (POUR BTL) OPTIME
TOPICAL | Status: DC | PRN
  Administered 2023-10-13: 1000 mL

## 2023-10-13 MED ORDER — ORAL CARE MOUTH RINSE: 15.0000 mL | Freq: Once | OROMUCOSAL | Status: AC

## 2023-10-13 MED ORDER — ACETAMINOPHEN 500 MG PO TABS
1000.0000 mg | ORAL_TABLET | Freq: Once | ORAL | Status: AC
  Administered 2023-10-13: 1000 mg via ORAL
  Filled 2023-10-13: qty 2

## 2023-10-13 SURGICAL SUPPLY — 32 items
BAG COUNTER SPONGE SURGICOUNT (BAG) IMPLANT
BLADE HEX COATED 2.75 (ELECTRODE) ×1 IMPLANT
BNDG GAUZE DERMACEA FLUFF 4 (GAUZE/BANDAGES/DRESSINGS) ×1 IMPLANT
COVER SURGICAL LIGHT HANDLE (MISCELLANEOUS) ×1 IMPLANT
DERMABOND ADVANCED .7 DNX12 (GAUZE/BANDAGES/DRESSINGS) IMPLANT
DISSECTOR ROUND CHERRY 3/8 STR (MISCELLANEOUS) ×1 IMPLANT
DRAIN PENROSE 0.25X18 (DRAIN) IMPLANT
DRAIN PENROSE 0.5X18 (DRAIN) ×1 IMPLANT
DRAPE LAPAROTOMY T 98X78 PEDS (DRAPES) ×1 IMPLANT
ELECT NDL TIP 2.8 STRL (NEEDLE) ×1 IMPLANT
ELECT NEEDLE TIP 2.8 STRL (NEEDLE) ×1 IMPLANT
ELECT REM PT RETURN 15FT ADLT (MISCELLANEOUS) ×1 IMPLANT
GLOVE SURG LX STRL 8.0 MICRO (GLOVE) ×1 IMPLANT
GOWN STRL REUS W/ TWL LRG LVL3 (GOWN DISPOSABLE) ×1 IMPLANT
KIT BASIN OR (CUSTOM PROCEDURE TRAY) ×1 IMPLANT
KIT TURNOVER KIT A (KITS) IMPLANT
NDL HYPO 22X1.5 SAFETY MO (MISCELLANEOUS) IMPLANT
NEEDLE HYPO 22X1.5 SAFETY MO (MISCELLANEOUS) IMPLANT
NS IRRIG 1000ML POUR BTL (IV SOLUTION) ×1 IMPLANT
PACK GENERAL/GYN (CUSTOM PROCEDURE TRAY) ×1 IMPLANT
SUPPORTER AHLETIC TETRA LG (SOFTGOODS) ×1 IMPLANT
SUT CHROMIC 2 0 SH (SUTURE) IMPLANT
SUT CHROMIC 3 0 SH 27 (SUTURE) IMPLANT
SUT ETHILON 3 0 PS 1 (SUTURE) IMPLANT
SUT MNCRL AB 4-0 PS2 18 (SUTURE) ×1 IMPLANT
SUT SILK 0 SH 30 (SUTURE) ×1 IMPLANT
SUT VIC AB 2-0 SH 27X BRD (SUTURE) IMPLANT
SUT VICRYL 0 UR6 27IN ABS (SUTURE) IMPLANT
SYR 10ML LL (SYRINGE) IMPLANT
SYR CONTROL 10ML LL (SYRINGE) IMPLANT
TOWEL OR 17X26 10 PK STRL BLUE (TOWEL DISPOSABLE) ×2 IMPLANT
WATER STERILE IRR 1000ML POUR (IV SOLUTION) ×1 IMPLANT

## 2023-10-13 NOTE — Anesthesia Postprocedure Evaluation (Signed)
 Anesthesia Post Note  Patient: Larry Velez  Procedure(s) Performed: HYDROCELECTOMY (Right)     Patient location during evaluation: PACU Anesthesia Type: General Level of consciousness: awake and alert Pain management: pain level controlled Vital Signs Assessment: post-procedure vital signs reviewed and stable Respiratory status: spontaneous breathing, nonlabored ventilation and respiratory function stable Cardiovascular status: stable and blood pressure returned to baseline Anesthetic complications: no  No notable events documented.  Last Vitals:  Vitals:   10/13/23 1330 10/13/23 1345  BP: 131/85 131/78  Pulse: 81 79  Resp: 14 17  Temp:    SpO2: 100% 94%    Last Pain:  Vitals:   10/13/23 1345  TempSrc:   PainSc: 0-No pain                 Juventino Oppenheim

## 2023-10-13 NOTE — Anesthesia Preprocedure Evaluation (Addendum)
 Anesthesia Evaluation  Patient identified by MRN, date of birth, ID band Patient awake    Reviewed: Allergy & Precautions, NPO status , Patient's Chart, lab work & pertinent test results, reviewed documented beta blocker date and time   History of Anesthesia Complications Negative for: history of anesthetic complications  Airway Mallampati: II  TM Distance: >3 FB Neck ROM: Full    Dental  (+) Dental Advisory Given   Pulmonary sleep apnea and Continuous Positive Airway Pressure Ventilation    Pulmonary exam normal        Cardiovascular hypertension, Pt. on home beta blockers and Pt. on medications Normal cardiovascular exam     Neuro/Psych negative neurological ROS  negative psych ROS   GI/Hepatic Neg liver ROS,GERD  ,,  Endo/Other   Obesity   Renal/GU negative Renal ROS     Musculoskeletal  (+) Arthritis ,    Abdominal   Peds  Hematology negative hematology ROS (+)   Anesthesia Other Findings   Reproductive/Obstetrics                             Anesthesia Physical Anesthesia Plan  ASA: 2  Anesthesia Plan: General   Post-op Pain Management: Tylenol  PO (pre-op)*   Induction: Intravenous  PONV Risk Score and Plan: 2 and Treatment may vary due to age or medical condition, Ondansetron , Dexamethasone  and Midazolam   Airway Management Planned: LMA  Additional Equipment: None  Intra-op Plan:   Post-operative Plan: Extubation in OR  Informed Consent: I have reviewed the patients History and Physical, chart, labs and discussed the procedure including the risks, benefits and alternatives for the proposed anesthesia with the patient or authorized representative who has indicated his/her understanding and acceptance.     Dental advisory given  Plan Discussed with: CRNA and Anesthesiologist  Anesthesia Plan Comments:        Anesthesia Quick Evaluation

## 2023-10-13 NOTE — H&P (Signed)
 Office Visit Report     09/21/2023   --------------------------------------------------------------------------------   Larry Velez  MRN: [id number]  DOB: 1965-08-30, 58 year old Male  SSN:    PRIMARY CARE:  Yolanda Hence, NP  PRIMARY CARE FAX:  [phone]  REFERRING:  Fernanda Howell II  PROVIDER:  Christina Coyer, M.D.  TREATING:  Yevonne Heman, M.D.  LOCATION:  [facility] [phone]     --------------------------------------------------------------------------------   CC/HPI: Cysto on testicle   Mr. Skibicki is a 58 year old male with     CC: I have swelling in my scrotum.  HPI: Jadien Lehigh is a 58 year-old male patient who was referred by Fernanda Howell II who is here for scrotal swelling.  He first noticed his hydrocele 7 years ago. His hydrocele is on the right side. He does have pain on the side of his hydrocele. His hydrocele does cause restriction of normal activities.   He has not had injuries to the testicles or scrotum. He has not had scrotal surgery. His hydrocele does bother him enough to consider surgical repair. He has not had a testicular infection.     ALLERGIES: Amoxicillin TABS Codeine Derivatives - Other Reaction, stomach cramps    MEDICATIONS: Viagra 50 MG Tablet  Nebivolol  HCl 10 MG Tablet  Pravastatin  Sodium 40 MG Tablet     GU PSH: None     PSH Notes: Appendectomy, Shoulder Surgery Left   NON-GU PSH: Appendectomy - 2015 Shoulder Arthroscopy/surgery, Bilateral     GU PMH: ED due to arterial insufficiency - 2019 Spermatocele of epididymis, Unspec - 2019, - 2019 Epididymal cyst, Epididymal cyst - 2015 Renal calculus    NON-GU PMH: Encounter for general adult medical examination without abnormal findings, Encounter for preventive health examination - 2015 Personal history of other endocrine, nutritional and metabolic disease, History of hyperlipidemia - 2015 Cardiac murmur,  unspecified GERD Hypercholesterolemia Hypertension Sleep Apnea    FAMILY HISTORY: Diabetes - Mother Hypertension - Mother Kidney Failure - Mother Parkinson's Disease - Father    Notes: 2 sons   SOCIAL HISTORY: Marital Status: Married Preferred Language: English; Ethnicity: Not Hispanic Or Latino; Race: White Current Smoking Status: Patient has never smoked.   Tobacco Use Assessment Completed: Used Tobacco in last 30 days? Does not use smokeless tobacco. Does drink.  Drinks 2 caffeinated drinks per day. Patient's occupation Associate Professor.    REVIEW OF SYSTEMS:    GU Review Male:   Patient reports erection problems. Patient denies frequent urination, hard to postpone urination, burning/ pain with urination, get up at night to urinate, leakage of urine, stream starts and stops, trouble starting your stream, have to strain to urinate , and penile pain.  Gastrointestinal (Upper):   Patient denies nausea, vomiting, and indigestion/ heartburn.  Gastrointestinal (Lower):   Patient denies diarrhea and constipation.  Constitutional:   Patient denies fever, night sweats, weight loss, and fatigue.  Skin:   Patient denies skin rash/ lesion and itching.  Eyes:   Patient denies double vision and blurred vision.  Ears/ Nose/ Throat:   Patient denies sore throat and sinus problems.  Hematologic/Lymphatic:   Patient denies swollen glands and easy bruising.  Cardiovascular:   Patient denies leg swelling and chest pains.  Respiratory:   Patient denies cough and shortness of breath.  Endocrine:   Patient denies excessive thirst.  Musculoskeletal:   Patient reports back pain and joint pain.   Neurological:   Patient denies headaches and dizziness.  Psychologic:   Patient denies depression and anxiety.   VITAL SIGNS:      09/21/2023 08:35 AM  Weight 225 lb / 102.06 kg  Height 70 in / 177.8 cm  BP 156/97 mmHg  Pulse 77 /min  Temperature 98.3 F / 36.8 C  BMI 32.3 kg/m   GU PHYSICAL  EXAMINATION:    Scrotum: No lesions. No edema. No cysts. No warts.  Epididymides: Right: no spermatocele, no masses, no cysts, no tenderness, no induration, no enlargement. Left: no spermatocele, no masses, no cysts, no tenderness, no induration, no enlargement.  Testes: 3-5 cm hydrocele right testis. No tenderness, no swelling, no enlargement left testis. No tenderness, no swelling, no enlargement right testis. Normal location left testis. Normal location right testis. No mass, no cyst, no varicocele, no hydrocele left testis. No mass, no cyst, no varicocele right testis.   Urethral Meatus: Normal size. No lesion, no wart, no discharge, no polyp. Normal location.  Penis: Circumcised, no warts, no cracks. No dorsal Peyronie's plaques, no left corporal Peyronie's plaques, no right corporal Peyronie's plaques, no scarring, no warts. No balanitis, no meatal stenosis.   MULTI-SYSTEM PHYSICAL EXAMINATION:    Constitutional: Well-nourished. No physical deformities. Normally developed. Good grooming.  Neurologic / Psychiatric: Oriented to time, oriented to place, oriented to person. No depression, no anxiety, no agitation.     Complexity of Data:  Records Review:   Previous Patient Records  X-Ray Review: Scrotal Ultrasound: Reviewed Films. Discussed With Patient.     PROCEDURES:         Scrotal Ultrasound - 16109  Right Testicle: Length: 5.21 cm  Height: 2.82 cm  Width: 3.96 cm  Left Testicle: Length: 5.27 cm  Height: 2.56 cm  Width: 3.30 cm  Scrotal Wall:  WNL  Left Testis/Epididymis:  Small cystic area epi head 1.7x 0.8x 1.0 cm; tiny cystic area in lat testicle 0.5x 0.3x 0.3 cm; small varicocele  Right Testis/Epididymis:  Large cystic area, epididymal cyst vs spermatocele 4.0x 2.7x 3.6 cm; small varicocle      Patient confirmed No Neulasta OnPro Device.   No echogenic abnormalities are seen involving the testicular parenchyma. Hypoechoeic extra-testicular fluid collection adjacent to  the RIGHT testicle. Small benign cyst involving the LEFT epididymis.          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 Risks include but are not limited bleeding complications, wound infection, chronic testicular pain, recurrence of the hydrocele, testicular loss and MI, CVA, DVT, PE and the inherent risks associated with general anesthesia. He voices understanding and wishes to proceed.

## 2023-10-13 NOTE — Anesthesia Procedure Notes (Signed)
 Procedure Name: LMA Insertion Date/Time: 10/13/2023 12:12 PM  Performed by: Manuela Sella, CRNAPre-anesthesia Checklist: Patient identified, Emergency Drugs available, Suction available and Patient being monitored Patient Re-evaluated:Patient Re-evaluated prior to induction Oxygen Delivery Method: Circle system utilized Preoxygenation: Pre-oxygenation with 100% oxygen Induction Type: IV induction Ventilation: Mask ventilation without difficulty LMA: LMA inserted LMA Size: 5.0 Number of attempts: 1 Placement Confirmation: positive ETCO2 and breath sounds checked- equal and bilateral Tube secured with: Tape Dental Injury: Teeth and Oropharynx as per pre-operative assessment

## 2023-10-13 NOTE — Transfer of Care (Signed)
 Immediate Anesthesia Transfer of Care Note  Patient: Larry Velez  Procedure(s) Performed: HYDROCELECTOMY (Right)  Patient Location: PACU  Anesthesia Type:General  Level of Consciousness: sedated  Airway & Oxygen Therapy: Patient Spontanous Breathing and Patient connected to face mask oxygen  Post-op Assessment: Report given to RN and Post -op Vital signs reviewed and stable  Post vital signs: Reviewed and stable  Last Vitals:  Vitals Value Taken Time  BP 128/73 10/13/23 1318  Temp    Pulse 88 10/13/23 1319  Resp 14 10/13/23 1319  SpO2 99 % 10/13/23 1319  Vitals shown include unfiled device data.  Last Pain:  Vitals:   10/13/23 0900  TempSrc: Oral  PainSc: 0-No pain         Complications: No notable events documented.

## 2023-10-13 NOTE — Op Note (Signed)
 Operative Note  Preoperative diagnosis:  1.  5 cm right hydrocele  Postoperative diagnosis: 1.  5 cm right spermatocele  Procedure(s): 1.  Right spermatocele ectomy  Surgeon: Yevonne Heman, MD  Assistants:  None  Anesthesia:  General  Complications:  None  EBL: 10 mL  Specimens: 1.  Right spermatocele sac  Drains/Catheters: 1. 1/4 inch Penrose drain in the dependent portion of the right hemiscrotum  Intraoperative findings:   Right spermatocele  Indication:  Larry Velez is a 58 y.o. male with a 5 cm right peritesticular fluid collection seen on physical exam and scrotal ultrasound causing right-sided orchialgia and limiting physical activity.  He has been consented for the above procedures, voices understanding and wishes to proceed.  Description of procedure:  After informed consent was obtained, the patient was brought to the operating room and general LMA anesthesia was administered.  The patient was placed in the supine position and prepped and draped in usual sterile fashion.  A timeout was then performed.  A 3 cm vertical incision was then made in the midportion of the scrotum along the median raphae.  The overlying cremasteric fibers and the rectus fascia was incised using electrocautery until the right testicle was completely exposed.  He was found to have a spermatocele protruding from the lateral aspects of the epididymis.  The spermatocele sac was then completely encircled using a combination of blunt dissection and electrocautery.  Once the spermatocele was on a stalk, it was incised using electrocautery.  The remaining stalk on the epididymal side of the spermatocele was then reapproximated using a 3-0 chromic suture in a figure-of-eight fashion.  A 1 cm incision was then made in the dependent portion of the right hemiscrotum and a 1/4 inch Penrose drain was then inserted into the right hemiscrotum.  The Penrose drain was secured in place to the skin using  an interrupted 2-0 nylon suture.  The right testicle was then placed back into the right hemiscrotum, in the correct anatomic orientation.  The scrotal incision was then closed in 2 layers and dressed with Dermabond.  Scrotal fluffs and a jockstrap were then applied.  The patient tolerated the procedure well and was transferred to the postanesthesia in stable condition.   Plan: Discharge home

## 2023-10-14 ENCOUNTER — Encounter (HOSPITAL_COMMUNITY): Payer: Self-pay | Admitting: Urology

## 2023-10-14 LAB — SURGICAL PATHOLOGY

## 2023-10-16 DIAGNOSIS — N4341 Spermatocele of epididymis, single: Secondary | ICD-10-CM | POA: Diagnosis not present

## 2023-10-28 DIAGNOSIS — G4733 Obstructive sleep apnea (adult) (pediatric): Secondary | ICD-10-CM | POA: Diagnosis not present

## 2023-11-04 ENCOUNTER — Other Ambulatory Visit: Payer: Self-pay | Admitting: Family Medicine

## 2023-11-04 DIAGNOSIS — N4341 Spermatocele of epididymis, single: Secondary | ICD-10-CM | POA: Diagnosis not present

## 2023-11-23 DIAGNOSIS — N4341 Spermatocele of epididymis, single: Secondary | ICD-10-CM | POA: Diagnosis not present

## 2023-11-27 DIAGNOSIS — G4733 Obstructive sleep apnea (adult) (pediatric): Secondary | ICD-10-CM | POA: Diagnosis not present

## 2023-12-28 DIAGNOSIS — G4733 Obstructive sleep apnea (adult) (pediatric): Secondary | ICD-10-CM | POA: Diagnosis not present

## 2024-01-29 DIAGNOSIS — G4733 Obstructive sleep apnea (adult) (pediatric): Secondary | ICD-10-CM | POA: Diagnosis not present

## 2024-03-02 ENCOUNTER — Other Ambulatory Visit

## 2024-03-02 DIAGNOSIS — Z6832 Body mass index (BMI) 32.0-32.9, adult: Secondary | ICD-10-CM | POA: Diagnosis not present

## 2024-03-02 DIAGNOSIS — E66811 Obesity, class 1: Secondary | ICD-10-CM | POA: Diagnosis not present

## 2024-03-02 DIAGNOSIS — I10 Essential (primary) hypertension: Secondary | ICD-10-CM

## 2024-03-02 DIAGNOSIS — E782 Mixed hyperlipidemia: Secondary | ICD-10-CM

## 2024-03-02 DIAGNOSIS — Z125 Encounter for screening for malignant neoplasm of prostate: Secondary | ICD-10-CM

## 2024-03-03 LAB — COMPREHENSIVE METABOLIC PANEL WITH GFR
AG Ratio: 1.9 (calc) (ref 1.0–2.5)
ALT: 39 U/L (ref 9–46)
AST: 25 U/L (ref 10–35)
Albumin: 4.4 g/dL (ref 3.6–5.1)
Alkaline phosphatase (APISO): 76 U/L (ref 35–144)
BUN: 18 mg/dL (ref 7–25)
CO2: 27 mmol/L (ref 20–32)
Calcium: 9.7 mg/dL (ref 8.6–10.3)
Chloride: 106 mmol/L (ref 98–110)
Creat: 1.07 mg/dL (ref 0.70–1.30)
Globulin: 2.3 g/dL (ref 1.9–3.7)
Glucose, Bld: 98 mg/dL (ref 65–99)
Potassium: 4.5 mmol/L (ref 3.5–5.3)
Sodium: 140 mmol/L (ref 135–146)
Total Bilirubin: 0.4 mg/dL (ref 0.2–1.2)
Total Protein: 6.7 g/dL (ref 6.1–8.1)
eGFR: 81 mL/min/1.73m2 (ref >=60)

## 2024-03-03 LAB — CBC WITH DIFFERENTIAL/PLATELET
Absolute Lymphocytes: 2640 {cells}/uL (ref 850–3900)
Absolute Monocytes: 576 {cells}/uL (ref 200–950)
Basophils Absolute: 40 {cells}/uL (ref 0–200)
Basophils Relative: 0.6 %
Eosinophils Absolute: 221 {cells}/uL (ref 15–500)
Eosinophils Relative: 3.3 %
HCT: 46 % (ref 38.5–50.0)
Hemoglobin: 15.5 g/dL (ref 13.2–17.1)
MCH: 31.9 pg (ref 27.0–33.0)
MCHC: 33.7 g/dL (ref 32.0–36.0)
MCV: 94.7 fL (ref 80.0–100.0)
MPV: 9.8 fL (ref 7.5–12.5)
Monocytes Relative: 8.6 %
Neutro Abs: 3223 {cells}/uL (ref 1500–7800)
Neutrophils Relative %: 48.1 %
Platelets: 223 Thousand/uL (ref 140–400)
RBC: 4.86 Million/uL (ref 4.20–5.80)
RDW: 12.1 % (ref 11.0–15.0)
Total Lymphocyte: 39.4 %
WBC: 6.7 Thousand/uL (ref 3.8–10.8)

## 2024-03-03 LAB — LIPID PANEL
Cholesterol: 180 mg/dL (ref <200)
HDL: 51 mg/dL (ref >=40)
LDL Cholesterol (Calc): 107 mg/dL — ABNORMAL HIGH
Non-HDL Cholesterol (Calc): 129 mg/dL (ref <130)
Total CHOL/HDL Ratio: 3.5 (calc) (ref <5.0)
Triglycerides: 122 mg/dL (ref <150)

## 2024-03-03 LAB — HEMOGLOBIN A1C
Hgb A1c MFr Bld: 5.6 % (ref <5.7)
Mean Plasma Glucose: 114 mg/dL
eAG (mmol/L): 6.3 mmol/L

## 2024-03-03 LAB — PSA: PSA: 0.87 ng/mL (ref <=4.00)

## 2024-03-07 ENCOUNTER — Ambulatory Visit: Payer: Self-pay | Admitting: Family Medicine

## 2024-03-10 ENCOUNTER — Ambulatory Visit: Admitting: Family Medicine

## 2024-03-14 ENCOUNTER — Encounter: Payer: Self-pay | Admitting: Family Medicine

## 2024-03-14 ENCOUNTER — Ambulatory Visit: Admitting: Family Medicine

## 2024-03-14 VITALS — BP 127/81 | HR 74 | Temp 97.7°F | Ht 70.0 in | Wt 242.0 lb

## 2024-03-14 DIAGNOSIS — Z6834 Body mass index (BMI) 34.0-34.9, adult: Secondary | ICD-10-CM

## 2024-03-14 DIAGNOSIS — E66811 Obesity, class 1: Secondary | ICD-10-CM | POA: Diagnosis not present

## 2024-03-14 DIAGNOSIS — E782 Mixed hyperlipidemia: Secondary | ICD-10-CM

## 2024-03-14 DIAGNOSIS — I10 Essential (primary) hypertension: Secondary | ICD-10-CM | POA: Diagnosis not present

## 2024-03-14 DIAGNOSIS — E6609 Other obesity due to excess calories: Secondary | ICD-10-CM

## 2024-03-14 DIAGNOSIS — B001 Herpesviral vesicular dermatitis: Secondary | ICD-10-CM | POA: Insufficient documentation

## 2024-03-14 DIAGNOSIS — Z23 Encounter for immunization: Secondary | ICD-10-CM | POA: Diagnosis not present

## 2024-03-14 DIAGNOSIS — N529 Male erectile dysfunction, unspecified: Secondary | ICD-10-CM | POA: Diagnosis not present

## 2024-03-14 MED ORDER — NEBIVOLOL HCL 10 MG PO TABS: 10.0000 mg | ORAL_TABLET | Freq: Every day | ORAL | 1 refills | Status: DC

## 2024-03-14 MED ORDER — VALACYCLOVIR HCL 500 MG PO TABS: 500.0000 mg | ORAL_TABLET | Freq: Two times a day (BID) | ORAL | 1 refills | Status: AC

## 2024-03-14 MED ORDER — SILDENAFIL CITRATE 20 MG PO TABS: 20.0000 mg | ORAL_TABLET | Freq: Every day | ORAL | 0 refills | Status: AC | PRN

## 2024-03-14 MED ORDER — PRAVASTATIN SODIUM 80 MG PO TABS: 80.0000 mg | ORAL_TABLET | Freq: Every day | ORAL | 1 refills | Status: DC

## 2024-03-14 NOTE — Assessment & Plan Note (Signed)
 Refill provided for sildenafil 20-60mg  nightly PRN

## 2024-03-14 NOTE — Assessment & Plan Note (Signed)
 Refill provided for Valtrex 500mg  BID x3 days

## 2024-03-14 NOTE — Assessment & Plan Note (Signed)
 Not at goal LDL<100. Increase Pravastatin  to 80mg  daily. I recommend consuming a heart healthy diet such as Mediterranean diet or DASH diet with whole grains, fruits, vegetable, fish, lean meats, nuts, and olive oil. Limit sweets and processed foods. I also encourage moderate intensity exercise 150 minutes weekly. This is 3-5 times weekly for 30-50 minutes each session. Goal should be pace of 3 miles/hours, or walking 1.5 miles in 30 minutes. The 10-year ASCVD risk score (Arnett DK, et al., 2019) is: 6.8%

## 2024-03-14 NOTE — Assessment & Plan Note (Signed)
 Counseled on importance of weight management for overall health. Encouraged low calorie, heart healthy diet and moderate intensity exercise 150 minutes weekly. This is 3-5 times weekly for 30-50 minutes each session. Goal should be pace of 3 miles/hours, or walking 1.5 miles in 30 minutes and include strength training. Discussed risks of obesity.

## 2024-03-14 NOTE — Progress Notes (Signed)
 Subjective:  HPI: Larry Velez is a 58 y.o. male presenting on 03/14/2024 for Medical Management of Chronic Issues (6 mo f/u flu shot today /Needs all medication refills and renewals from historical providers )   HPI Patient is in today for chronic condition management. PMH includes HTN, HLD, and allergies.   HTN: on nebivolol  10mg  daily Denies chest pain, palpitations, recurrent headaches, vision changes, lightheadedness, dizziness, dyspnea on exertion, or swelling of extremities.  HLD: on Pravastatin  40mg  daily Lipid Panel     Component Value Date/Time   CHOL 180 03/02/2024 0815   TRIG 122 03/02/2024 0815   HDL 51 03/02/2024 0815   CHOLHDL 3.5 03/02/2024 0815   VLDL 23.2 06/25/2012 0741   LDLCALC 107 (H) 03/02/2024 0815     Discussed the use of AI scribe software for clinical note transcription with the patient, who gave verbal consent to proceed.  History of Present Illness Larry Velez is a 58 year old male who presents for medication management and refills.  He takes pravastatin  40 mg daily for hyperlipidemia. He does not exercise regularly or maintain a healthy diet.  He experiences cold sores approximately twice a year and uses Valtrex (valacyclovir) 500 mg twice daily for three days at the onset of symptoms, which he finds effective. He had a cold sore within the last couple of months and requests a refill of Valtrex.  He occasionally experiences palpitations, which have been evaluated by a cardiologist in the past and deemed not concerning. No associated chest pain, shortness of breath, or dizziness.  He uses sildenafil 20 mg tablets, taking three tablets as needed. He reports that his previous prescription was for a large quantity, which he obtained through [employer] due to cost-effectiveness. He requests a new prescription as his current supply is old.  He takes Aleve sparingly and does not take Tylenol  frequently. He dislikes taking medications in  general.    Review of Systems  Cardiovascular:  Positive for palpitations.  All other systems reviewed and are negative.   Relevant past medical history reviewed and updated as indicated.   Past Medical History:  Diagnosis Date   DJD (degenerative joint disease)    Family history of factor V deficiency    GERD (gastroesophageal reflux disease)    resolved with diet change, no meds for 3 months   Hyperlipidemia    Hypertension    Impingement syndrome of left shoulder 07/18/2011   PAC (premature atrial contraction)    Partial tear subscapularis tendon 07/18/2011   Sleep apnea    cpap     Past Surgical History:  Procedure Laterality Date   APPENDECTOMY  2015   COSMETIC SURGERY  1972   On both ears   EYE SURGERY     lasik   HEMORROIDECTOMY  4 years ago   HYDROCELE EXCISION Right 10/13/2023   Procedure: HYDROCELECTOMY;  Surgeon: Devere Lonni Righter, MD;  Location: [facility];  Service: Urology;  Laterality: Right;  RIGHT HYDROCELECTOMY   LAPAROSCOPIC APPENDECTOMY N/A 01/09/2014   Procedure: APPENDECTOMY LAPAROSCOPIC;  Surgeon: Morene ONEIDA Olives, MD;  Location: [facility];  Service: General;  Laterality: N/A;   left shoulder surgery Bilateral    Arthroscopic    Allergies and medications reviewed and updated.   Current Outpatient Medications:    ALEVE 220 MG tablet, Take 220-440 mg by mouth 2 (two) times daily as needed (for pain or headaches)., Disp: , Rfl:    nebivolol  (BYSTOLIC ) 10 MG tablet, Take 1 tablet (10 mg  total) by mouth daily., Disp: 90 tablet, Rfl: 1   pravastatin  (PRAVACHOL ) 80 MG tablet, Take 1 tablet (80 mg total) by mouth daily., Disp: 90 tablet, Rfl: 1   sildenafil (REVATIO) 20 MG tablet, Take 1-3 tablets (20-60 mg total) by mouth daily as needed (for E.D.)., Disp: 60 tablet, Rfl: 0   TYLENOL  500 MG tablet, Take 500-1,000 mg by mouth every 6 (six) hours as needed (for pain or headaches)., Disp: , Rfl:    valACYclovir (VALTREX) 500 MG tablet, Take 1 tablet  (500 mg total) by mouth 2 (two) times daily for 3 days., Disp: 6 tablet, Rfl: 1  Allergies  Allergen Reactions   Amoxicillin Other (See Comments)    Stomach cramps   Codeine Nausea And Vomiting and Other (See Comments)    Stomach cramps, also    Objective:   BP 127/81   Pulse 74   Temp 97.7 F (36.5 C)   Ht 5' 10 (1.778 m)   Wt 242 lb (109.8 kg)   SpO2 97%   BMI 34.72 kg/m      03/14/2024    8:38 AM 10/13/2023    1:45 PM 10/13/2023    1:30 PM  Vitals with BMI  Height 5' 10    Weight 242 lbs    BMI 34.72    Systolic 127 131 868  Diastolic 81 78 85  Pulse 74 79 81     Physical Exam Vitals and nursing note reviewed.  Constitutional:      Appearance: Normal appearance. He is obese.  HENT:     Head: Normocephalic and atraumatic.  Cardiovascular:     Rate and Rhythm: Normal rate and regular rhythm.     Pulses: Normal pulses.     Heart sounds: Normal heart sounds.  Pulmonary:     Effort: Pulmonary effort is normal.     Breath sounds: Normal breath sounds.  Skin:    General: Skin is warm and dry.     Capillary Refill: Capillary refill takes less than 2 seconds.  Neurological:     General: No focal deficit present.     Mental Status: He is alert and oriented to person, place, and time. Mental status is at baseline.  Psychiatric:        Mood and Affect: Mood normal.        Behavior: Behavior normal.        Thought Content: Thought content normal.        Judgment: Judgment normal.     Assessment & Plan:  Primary hypertension Assessment & Plan: Chronic. Well controlled. Continue Nebivolol  10mg  daily and home monitoring. Recommend heart healthy diet such as Mediterranean diet with whole grains, fruits, vegetable, fish, lean meats, nuts, and olive oil. Limit salt. Encouraged moderate walking, 3-5 times/week for 30-50 minutes each session. Aim for at least 150 minutes.week. Goal should be pace of 3 miles/hours, or walking 1.5 miles in 30 minutes. Avoid tobacco  products. Avoid excess alcohol. Take medications as prescribed and bring medications and blood pressure log with cuff to each office visit. Seek medical care for chest pain, palpitations, shortness of breath with exertion, dizziness/lightheadedness, vision changes, recurrent headaches, or swelling of extremities. Follow up in 6 months.     Mixed hyperlipidemia Assessment & Plan: Not at goal LDL<100. Increase Pravastatin  to 80mg  daily. I recommend consuming a heart healthy diet such as Mediterranean diet or DASH diet with whole grains, fruits, vegetable, fish, lean meats, nuts, and olive oil. Limit sweets and processed  foods. I also encourage moderate intensity exercise 150 minutes weekly. This is 3-5 times weekly for 30-50 minutes each session. Goal should be pace of 3 miles/hours, or walking 1.5 miles in 30 minutes. The 10-year ASCVD risk score (Arnett DK, et al., 2019) is: 6.8%    Class 1 obesity due to excess calories with serious comorbidity and body mass index (BMI) of 34.0 to 34.9 in adult Assessment & Plan: Counseled on importance of weight management for overall health. Encouraged low calorie, heart healthy diet and moderate intensity exercise 150 minutes weekly. This is 3-5 times weekly for 30-50 minutes each session. Goal should be pace of 3 miles/hours, or walking 1.5 miles in 30 minutes and include strength training. Discussed risks of obesity.   Erectile dysfunction, unspecified erectile dysfunction type Assessment & Plan: Refill provided for sildenafil 20-60mg  nightly PRN   Recurrent cold sores Assessment & Plan: Refill provided for Valtrex 500mg  BID x3 days   Need for vaccination -     Pneumococcal conjugate vaccine 20-valent -     Flu vaccine trivalent PF, 6mos and older(Flulaval,Afluria,Fluarix,Fluzone)  Other orders -     valACYclovir HCl; Take 1 tablet (500 mg total) by mouth 2 (two) times daily for 3 days.  Dispense: 6 tablet; Refill: 1 -     Nebivolol  HCl; Take 1  tablet (10 mg total) by mouth daily.  Dispense: 90 tablet; Refill: 1 -     Pravastatin  Sodium; Take 1 tablet (80 mg total) by mouth daily.  Dispense: 90 tablet; Refill: 1 -     Sildenafil Citrate; Take 1-3 tablets (20-60 mg total) by mouth daily as needed (for E.D.).  Dispense: 60 tablet; Refill: 0     Follow up plan: Return in about 6 months (around 09/12/2024) for annual physical with labs 1 week prior.  Jeoffrey GORMAN Barrio, FNP

## 2024-03-14 NOTE — Assessment & Plan Note (Signed)
 Chronic. Well controlled. Continue Nebivolol  10mg  daily and home monitoring. Recommend heart healthy diet such as Mediterranean diet with whole grains, fruits, vegetable, fish, lean meats, nuts, and olive oil. Limit salt. Encouraged moderate walking, 3-5 times/week for 30-50 minutes each session. Aim for at least 150 minutes.week. Goal should be pace of 3 miles/hours, or walking 1.5 miles in 30 minutes. Avoid tobacco products. Avoid excess alcohol. Take medications as prescribed and bring medications and blood pressure log with cuff to each office visit. Seek medical care for chest pain, palpitations, shortness of breath with exertion, dizziness/lightheadedness, vision changes, recurrent headaches, or swelling of extremities. Follow up in 6 months.

## 2024-03-28 ENCOUNTER — Encounter: Payer: Self-pay | Admitting: Cardiovascular Disease

## 2024-03-28 ENCOUNTER — Ambulatory Visit: Attending: Cardiovascular Disease | Admitting: Cardiovascular Disease

## 2024-03-28 ENCOUNTER — Ambulatory Visit (INDEPENDENT_AMBULATORY_CARE_PROVIDER_SITE_OTHER)

## 2024-03-28 VITALS — BP 160/90 | HR 70 | Ht 70.0 in | Wt 242.8 lb

## 2024-03-28 DIAGNOSIS — I491 Atrial premature depolarization: Secondary | ICD-10-CM

## 2024-03-28 DIAGNOSIS — R072 Precordial pain: Secondary | ICD-10-CM

## 2024-03-28 DIAGNOSIS — I499 Cardiac arrhythmia, unspecified: Secondary | ICD-10-CM | POA: Diagnosis not present

## 2024-03-28 DIAGNOSIS — R002 Palpitations: Secondary | ICD-10-CM

## 2024-03-28 DIAGNOSIS — Z8249 Family history of ischemic heart disease and other diseases of the circulatory system: Secondary | ICD-10-CM

## 2024-03-28 DIAGNOSIS — R0602 Shortness of breath: Secondary | ICD-10-CM

## 2024-03-28 MED ORDER — METOPROLOL TARTRATE 50 MG PO TABS: 50.0000 mg | ORAL_TABLET | Freq: Two times a day (BID) | ORAL | 3 refills | Status: AC

## 2024-03-28 MED ORDER — METOPROLOL TARTRATE 50 MG PO TABS: ORAL_TABLET | ORAL | 0 refills | Status: DC

## 2024-03-28 NOTE — Progress Notes (Signed)
 Cardiology Office Note:  .   Date:  03/28/2024  ID:  Larry Velez, DOB 10-May-1966, MRN [id number] PCP: Kayla Jeoffrey RAMAN, FNP   HeartCare Providers Cardiologist:  Darryle ONEIDA Decent, MD { History of Present Illness: .    Chief Complaint  Patient presents with   Palpitations    Larry Velez is a 58 y.o. male with history of HTN, HLD who presents for the evaluation of irregular heart beat at the request of Kayla Jeoffrey RAMAN, FNP.   History of Present Illness   Larry Velez is a 58 year old male with hypertension, hyperlipidemia, and sleep apnea who presents with palpitations.  He has experienced palpitations for many years, with a significant increase in frequency and intensity over the past three days, particularly on Friday, Saturday, and Sunday. The sensation is described as a 'flutter' and is more noticeable when sitting, less so during movement or exercise. He found relief with hydration and movement. Today, the palpitations are less severe but still present.  No chest pain or discomfort is reported, but he experiences shortness of breath when walking uphill, which he attributes to being out of shape. His family history is significant for heart disease, including a father with valve replacements, a mother with congestive heart failure, a sister with a bicuspid aortic valve and elevated LPA, and a brother who has undergone bypass surgery.  His current medications include nebivolol  for hypertension and pravastatin  for hyperlipidemia, with a recent increase in the dose of pravastatin . He consistently uses a CPAP machine for sleep apnea. He denies smoking and reports minimal alcohol consumption. He works at a desk job and is married with two grown children and two grandchildren.   EKG shows SR with PACs.          Problem List HTN HLD -T chol 180, HDL 51, LDL 107, TG 122 OSA    ROS: All other ROS reviewed and negative. Pertinent positives noted in the HPI.      Studies Reviewed: SABRA   EKG Interpretation Date/Time:  Monday March 28 2024 14:36:49 EST Ventricular Rate:  73 PR Interval:  176 QRS Duration:  80 QT Interval:  360 QTC Calculation: 396 R Axis:   28  Text Interpretation: Sinus rhythm with occasional Premature atrial complexes Abnormal ECG Confirmed by Decent Darryle [phone]) on 03/28/2024 2:39:14 PM    EKG 10/02/2023  NSR 61 bpm Physical Exam:   VS:  BP (!) 160/90 (BP Location: Left Arm, Patient Position: Sitting, Cuff Size: Normal)   Pulse 70   Ht 5' 10 (1.778 m)   Wt 242 lb 12.8 oz (110.1 kg)   SpO2 96%   BMI 34.84 kg/m    Wt Readings from Last 3 Encounters:  03/28/24 242 lb 12.8 oz (110.1 kg)  03/14/24 242 lb (109.8 kg)  10/13/23 230 lb (104.3 kg)    GEN: Well nourished, well developed in no acute distress NECK: No JVD; No carotid bruits CARDIAC: RRR, no murmurs, rubs, gallops RESPIRATORY:  Clear to auscultation without rales, wheezing or rhonchi  ABDOMEN: Soft, non-tender, non-distended EXTREMITIES:  No edema; No deformity  ASSESSMENT AND PLAN: .   Assessment and Plan    Palpitations and exertional shortness of breath PACs Intermittent palpitations with exertional dyspnea. Strong family history of heart disease. PACs on EKG today.  - Stop nebivolol  and start metoprolol  tartrate 50 mg BID.  May have better PAC suppression.  - Ordered echocardiogram to assess heart valve function and rule out bicuspid  aortic valve. - Ordered coronary CTA to evaluate for coronary artery disease. - Prescribed metoprolol  50 mg to take two hours before coronary CTA. - Ordered heart monitor for two weeks to assess for arrhythmias.  Hypertension Blood pressure elevated at 160/90 mmHg, potentially exacerbating symptoms. - Encouraged monitoring of blood pressure. - Advised to maintain blood pressure less than 130/80 mmHg. - Transition to metoprolol  for PAC suppression.   Hyperlipidemia Recent LDL level is 107 mg/dL. Currently on  pravastatin  with recent dose increase. - Continue pravastatin . - Ordered LPA test to assess genetic marker for heart disease. - Will adjust cholesterol medication based on test results.              Follow-up: Return in about 6 months (around 09/25/2024) for Lilyann Gravelle.  Signed, Darryle T. Barbaraann, MD, [facility]  [facility]  [address] [place], [place] [address] [phone]  3:21 PM

## 2024-03-28 NOTE — Patient Instructions (Addendum)
 Medication Instructions:  Stop Nebivolol   Start Metoprolol  Tartrate (Lopressor ) 50 mg take one tablet twice daily *If you need a refill on your cardiac medications before your next appointment, please call your pharmacy*  Lab Work: Today- LPA, BMET If you have labs (blood work) drawn today and your tests are completely normal, you will receive your results only by: MyChart Message (if you have MyChart) OR A paper copy in the mail If you have any lab test that is abnormal or we need to change your treatment, we will call you to review the results.  Testing/Procedures:   Your cardiac CT will be scheduled at one of the below locations:    [facility] [address]  [place], [place] [address]  If scheduled at the [facility] at [address], please enter the parking lot using the [address] entrance and use the FREE valet service at the patient drop-off area. Enter the building and check-in with registration on the main floor.   Please follow these instructions carefully (unless otherwise directed):  An IV will be required for this test and Nitroglycerin will be given.  Hold all erectile dysfunction medications at least 3 days (72 hrs) prior to test. (Ie viagra, cialis, sildenafil, tadalafil, etc)   On the Night Before the Test: Be sure to Drink plenty of water. Do not consume any caffeinated/decaffeinated beverages or chocolate 12 hours prior to your test. Do not take any antihistamines 12 hours prior to your test.  If the patient has contrast allergy: Patient will need a prescription for Prednisone and very clear instructions (as follows): Prednisone 50 mg - take 13 hours prior to test Take another Prednisone 50 mg 7 hours prior to test Take another Prednisone 50 mg 1 hour prior to test Take Benadryl 50 mg 1 hour prior to test Patient must complete all four doses of above prophylactic medications. Patient will need a ride after  test due to Benadryl.  On the Day of the Test: Drink plenty of water until 1 hour prior to the test. Do not eat any food 1 hour prior to test. You may take your regular medications prior to the test.  Take metoprolol  (Lopressor ) two hours prior to test. If you take Furosemide/Hydrochlorothiazide/Spironolactone/Chlorthalidone, please HOLD on the morning of the test. Patients who wear a continuous glucose monitor MUST remove the device prior to scanning. FEMALES- please wear underwire-free bra if available, avoid dresses & tight clothing  After the Test: Drink plenty of water. After receiving IV contrast, you may experience a mild flushed feeling. This is normal. On occasion, you may experience a mild rash up to 24 hours after the test. This is not dangerous. If this occurs, you can take Benadryl 25 mg, Zyrtec, Claritin, or Allegra and increase your fluid intake. (Patients taking Tikosyn should avoid Benadryl, and may take Zyrtec, Claritin, or Allegra) If you experience trouble breathing, this can be serious. If it is severe call 911 IMMEDIATELY. If it is mild, please call our office.  We will call to schedule your test 2-4 weeks out understanding that some insurance companies will need an authorization prior to the service being performed.   For more information and frequently asked questions, please visit our website : [email or web address]  For non-scheduling related questions, please contact the cardiac imaging nurse navigator should you have any questions/concerns: Cardiac Imaging Nurse Navigators Direct Office Dial: [phone]   For scheduling needs, including cancellations and rescheduling, please call Brittany, [phone].  ZIO XT- Long Term Monitor Instructions  Your physician has requested you wear a ZIO patch monitor for 14 days.  This is a single patch monitor. [employer] supplies one patch monitor per enrollment. Additional stickers are not available. Please  do not apply patch if you will be having a Nuclear Stress Test,  Echocardiogram, Cardiac CT, MRI, or Chest Xray during the period you would be wearing the  monitor. The patch cannot be worn during these tests. You cannot remove and re-apply the  ZIO XT patch monitor.   Billing and Patient Assistance Program Information  We have supplied [employer] with any of your insurance information on file for billing purposes. [employer] offers a sliding scale Patient Assistance Program for patients that do not have  insurance, or whose insurance does not completely cover the cost of the ZIO monitor.  You must apply for the Patient Assistance Program to qualify for this discounted rate.  To apply, please call [employer] at [phone], select option 4, select option 2, ask to apply for  Patient Assistance Program. Meredeth will ask your household income, and how many people  are in your household. They will quote your out-of-pocket cost based on that information.  [employer] will also be able to set up a 67-month, interest-free payment plan if needed.  When you are ready to remove the patch, follow instructions on the last 2 pages of Patient  Logbook. Stick patch monitor onto the last page of Patient Logbook.  Place Patient Logbook in the blue and white box. Use locking tab on box and tape box closed  securely. The blue and white box has prepaid postage on it. Please place it in the mailbox as  soon as possible. Your physician should have your test results approximately 7 days after the  monitor has been mailed back to [facility].  Call [facility] Customer Care at [phone] if you have questions regarding  your ZIO XT patch monitor. Call them immediately if you see an orange light blinking on your  monitor.  If your monitor falls off in less than 4 days, contact our Monitor department at [phone].  If your monitor becomes loose or falls off after 4 days call [employer] at [phone] for   suggestions on securing your monitor      Your physician has requested that you have an echocardiogram. Echocardiography is a painless test that uses sound waves to create images of your heart. It provides your doctor with information about the size and shape of your heart and how well your heart's chambers and valves are working. This procedure takes approximately one hour. There are no restrictions for this procedure. Please do NOT wear cologne, perfume, aftershave, or lotions (deodorant is allowed). Please arrive 15 minutes prior to your appointment time.  Please note: We ask at that you not bring children with you during ultrasound (echo/ vascular) testing. Due to room size and safety concerns, children are not allowed in the ultrasound rooms during exams. Our front office staff cannot provide observation of children in our lobby area while testing is being conducted. An adult accompanying a patient to their appointment will only be allowed in the ultrasound room at the discretion of the ultrasound technician under special circumstances. We apologize for any inconvenience.   Follow-Up: At [facility], you and your health needs are our priority.  As part of our continuing mission to provide you with exceptional heart care, our providers are all part of one team.  This team includes  your primary Cardiologist (physician) and Advanced Practice Providers or APPs (Physician Assistants and Nurse Practitioners) who all work together to provide you with the care you need, when you need it.  Your next appointment:   6 month(s)  Provider:   Darryle ONEIDA Decent, MD

## 2024-03-28 NOTE — Progress Notes (Unsigned)
 ZIO serial # [id number] from office inventory applied to patient.

## 2024-03-29 ENCOUNTER — Ambulatory Visit: Payer: Self-pay | Admitting: Cardiovascular Disease

## 2024-03-29 LAB — BASIC METABOLIC PANEL WITH GFR
BUN/Creatinine Ratio: 15 (ref 9–20)
BUN: 16 mg/dL (ref 6–24)
CO2: 24 mmol/L (ref 20–29)
Calcium: 10.3 mg/dL — ABNORMAL HIGH (ref 8.7–10.2)
Chloride: 103 mmol/L (ref 96–106)
Creatinine, Ser: 1.09 mg/dL (ref 0.76–1.27)
Glucose: 84 mg/dL (ref 70–99)
Potassium: 4.6 mmol/L (ref 3.5–5.2)
Sodium: 142 mmol/L (ref 134–144)
eGFR: 79 mL/min/1.73 (ref >59)

## 2024-03-29 LAB — LIPOPROTEIN A (LPA): Lipoprotein (a): 135.1 nmol/L — ABNORMAL HIGH (ref <75.0)

## 2024-03-29 MED ORDER — ROSUVASTATIN CALCIUM 40 MG PO TABS: 40.0000 mg | ORAL_TABLET | Freq: Every day | ORAL | 3 refills | Status: AC

## 2024-03-29 NOTE — Addendum Note (Signed)
 Addended by: CHAUVIGNE, Roselina Burgueno on: 03/29/2024 05:24 PM   Modules accepted: Orders

## 2024-03-29 NOTE — Telephone Encounter (Signed)
 Prescription has been sent in

## 2024-03-31 ENCOUNTER — Encounter (HOSPITAL_COMMUNITY): Payer: Self-pay

## 2024-04-04 ENCOUNTER — Ambulatory Visit (HOSPITAL_COMMUNITY)
Admission: RE | Admit: 2024-04-04 | Discharge: 2024-04-04 | Disposition: A | Discharge status: Home / Self Care | Source: Ambulatory Visit | Attending: Cardiology | Admitting: Cardiology

## 2024-04-04 DIAGNOSIS — R072 Precordial pain: Secondary | ICD-10-CM | POA: Insufficient documentation

## 2024-04-04 MED ORDER — IOHEXOL 350 MG/ML SOLN
95.0000 mL | Freq: Once | INTRAVENOUS | Status: AC | PRN
  Administered 2024-04-04: 95 mL via INTRAVENOUS

## 2024-04-04 MED ORDER — NITROGLYCERIN 0.4 MG SL SUBL
0.8000 mg | SUBLINGUAL_TABLET | Freq: Once | SUBLINGUAL | Status: AC
  Administered 2024-04-04: 0.8 mg via SUBLINGUAL

## 2024-04-12 DIAGNOSIS — R002 Palpitations: Secondary | ICD-10-CM | POA: Diagnosis not present

## 2024-04-19 DIAGNOSIS — R002 Palpitations: Secondary | ICD-10-CM | POA: Diagnosis not present

## 2024-04-19 DIAGNOSIS — Z83719 Family history of colon polyps, unspecified: Secondary | ICD-10-CM | POA: Diagnosis not present

## 2024-04-19 DIAGNOSIS — R635 Abnormal weight gain: Secondary | ICD-10-CM | POA: Diagnosis not present

## 2024-04-19 DIAGNOSIS — Z1211 Encounter for screening for malignant neoplasm of colon: Secondary | ICD-10-CM | POA: Diagnosis not present

## 2024-04-25 ENCOUNTER — Other Ambulatory Visit

## 2024-04-25 DIAGNOSIS — E782 Mixed hyperlipidemia: Secondary | ICD-10-CM | POA: Diagnosis not present

## 2024-04-26 ENCOUNTER — Ambulatory Visit: Payer: Self-pay | Admitting: Family Medicine

## 2024-04-27 LAB — CBC WITH DIFFERENTIAL/PLATELET
Absolute Lymphocytes: 3292 {cells}/uL (ref 850–3900)
Absolute Monocytes: 599 {cells}/uL (ref 200–950)
Basophils Absolute: 29 {cells}/uL (ref 0–200)
Basophils Relative: 0.4 %
Eosinophils Absolute: 219 {cells}/uL (ref 15–500)
Eosinophils Relative: 3 %
HCT: 46.2 % (ref 39.4–51.1)
Hemoglobin: 15.5 g/dL (ref 13.2–17.1)
MCH: 31.6 pg (ref 27.0–33.0)
MCHC: 33.5 g/dL (ref 31.6–35.4)
MCV: 94.3 fL (ref 81.4–101.7)
MPV: 9.8 fL (ref 7.5–12.5)
Monocytes Relative: 8.2 %
Neutro Abs: 3161 {cells}/uL (ref 1500–7800)
Neutrophils Relative %: 43.3 %
Platelets: 233 Thousand/uL (ref 140–400)
RBC: 4.9 Million/uL (ref 4.20–5.80)
RDW: 12 % (ref 11.0–15.0)
Total Lymphocyte: 45.1 %
WBC: 7.3 Thousand/uL (ref 3.8–10.8)

## 2024-04-27 LAB — COMPREHENSIVE METABOLIC PANEL WITH GFR
AG Ratio: 2.1 (calc) (ref 1.0–2.5)
ALT: 43 U/L (ref 9–46)
AST: 30 U/L (ref 10–35)
Albumin: 4.7 g/dL (ref 3.6–5.1)
Alkaline phosphatase (APISO): 80 U/L (ref 35–144)
BUN: 24 mg/dL (ref 7–25)
CO2: 25 mmol/L (ref 20–32)
Calcium: 9.5 mg/dL (ref 8.6–10.3)
Chloride: 107 mmol/L (ref 98–110)
Creat: 1.02 mg/dL (ref 0.70–1.30)
Globulin: 2.2 g/dL (ref 1.9–3.7)
Glucose, Bld: 101 mg/dL — ABNORMAL HIGH (ref 65–99)
Potassium: 4.3 mmol/L (ref 3.5–5.3)
Sodium: 141 mmol/L (ref 135–146)
Total Bilirubin: 0.4 mg/dL (ref 0.2–1.2)
Total Protein: 6.9 g/dL (ref 6.1–8.1)
eGFR: 85 mL/min/1.73m2 (ref >=60)

## 2024-04-27 LAB — HEMOGLOBIN A1C
Hgb A1c MFr Bld: 5.6 % (ref <5.7)
Mean Plasma Glucose: 114 mg/dL
eAG (mmol/L): 6.3 mmol/L

## 2024-04-27 LAB — TEST AUTHORIZATION

## 2024-04-27 LAB — LIPID PANEL
Cholesterol: 132 mg/dL (ref <200)
HDL: 51 mg/dL (ref >=40)
LDL Cholesterol (Calc): 65 mg/dL
Non-HDL Cholesterol (Calc): 81 mg/dL (ref <130)
Total CHOL/HDL Ratio: 2.6 (calc) (ref <5.0)
Triglycerides: 75 mg/dL (ref <150)

## 2024-05-05 ENCOUNTER — Ambulatory Visit (HOSPITAL_COMMUNITY): Admission: RE | Admit: 2024-05-05 | Attending: Cardiovascular Disease

## 2024-05-05 DIAGNOSIS — R0602 Shortness of breath: Secondary | ICD-10-CM | POA: Insufficient documentation

## 2024-05-05 LAB — ECHOCARDIOGRAM COMPLETE
Area-P 1/2: 4.18 cm2
S' Lateral: 2.9 cm

## 2024-09-09 ENCOUNTER — Other Ambulatory Visit

## 2024-09-12 ENCOUNTER — Encounter: Admitting: Family Medicine
